# Patient Record
Sex: Male | Born: 1950 | Race: White | Hispanic: No | Marital: Married | Smoking: Former smoker
Health system: Southern US, Community
[De-identification: ages and names within clinical notes are randomized; demographics above are authoritative.]

## PROBLEM LIST (undated history)

## (undated) DIAGNOSIS — T7840XA Allergy, unspecified, initial encounter: Secondary | ICD-10-CM

## (undated) DIAGNOSIS — R972 Elevated prostate specific antigen [PSA]: Secondary | ICD-10-CM

## (undated) DIAGNOSIS — R251 Tremor, unspecified: Secondary | ICD-10-CM

## (undated) DIAGNOSIS — K219 Gastro-esophageal reflux disease without esophagitis: Secondary | ICD-10-CM

## (undated) DIAGNOSIS — M1990 Unspecified osteoarthritis, unspecified site: Secondary | ICD-10-CM

## (undated) DIAGNOSIS — H9319 Tinnitus, unspecified ear: Secondary | ICD-10-CM

## (undated) DIAGNOSIS — B1920 Unspecified viral hepatitis C without hepatic coma: Secondary | ICD-10-CM

## (undated) HISTORY — PX: JOINT REPLACEMENT: SHX530

## (undated) HISTORY — DX: Allergy, unspecified, initial encounter: T78.40XA

## (undated) HISTORY — PX: PROSTATE SURGERY: SHX751

## (undated) HISTORY — PX: REPLACEMENT TOTAL KNEE BILATERAL: SUR1225

## (undated) HISTORY — DX: Unspecified viral hepatitis C without hepatic coma: B19.20

## (undated) HISTORY — PX: HERNIA REPAIR: SHX51

---

## 2009-09-12 ENCOUNTER — Ambulatory Visit: Payer: Self-pay | Admitting: Family Medicine

## 2009-09-15 ENCOUNTER — Ambulatory Visit: Payer: Self-pay | Admitting: Internal Medicine

## 2009-09-29 ENCOUNTER — Encounter: Payer: Self-pay | Admitting: Orthopedic Surgery

## 2009-11-02 ENCOUNTER — Ambulatory Visit: Payer: Self-pay | Admitting: Urology

## 2010-03-22 ENCOUNTER — Ambulatory Visit: Payer: Self-pay | Admitting: Pain Medicine

## 2010-03-29 ENCOUNTER — Ambulatory Visit: Payer: Self-pay | Admitting: Pain Medicine

## 2010-04-05 ENCOUNTER — Ambulatory Visit: Payer: Self-pay | Admitting: Pain Medicine

## 2010-04-13 ENCOUNTER — Ambulatory Visit: Payer: Self-pay | Admitting: Pain Medicine

## 2010-07-19 ENCOUNTER — Ambulatory Visit: Payer: Self-pay | Admitting: Internal Medicine

## 2010-07-24 ENCOUNTER — Ambulatory Visit: Payer: Self-pay | Admitting: Orthopedic Surgery

## 2010-08-01 ENCOUNTER — Ambulatory Visit: Payer: Self-pay | Admitting: Orthopedic Surgery

## 2010-08-02 ENCOUNTER — Inpatient Hospital Stay: Payer: Self-pay | Admitting: Orthopedic Surgery

## 2010-08-24 ENCOUNTER — Encounter: Payer: Self-pay | Admitting: Orthopedic Surgery

## 2010-08-31 ENCOUNTER — Encounter: Payer: Self-pay | Admitting: Orthopedic Surgery

## 2011-07-12 ENCOUNTER — Other Ambulatory Visit: Payer: Self-pay | Admitting: Orthopedic Surgery

## 2011-07-12 LAB — BODY FLUID CELL COUNT WITH DIFFERENTIAL
Basophil: 0 %
Lymphocytes: 22 %
Neutrophils: 20 %
Other Cells BF: 0 %
Other Mononuclear Cells: 58 %

## 2011-07-12 LAB — SYNOVIAL FLUID, CRYSTAL: Crystals, Joint Fluid: NONE SEEN

## 2012-06-30 DIAGNOSIS — R972 Elevated prostate specific antigen [PSA]: Secondary | ICD-10-CM | POA: Insufficient documentation

## 2012-06-30 DIAGNOSIS — N138 Other obstructive and reflux uropathy: Secondary | ICD-10-CM | POA: Insufficient documentation

## 2012-06-30 DIAGNOSIS — R3911 Hesitancy of micturition: Secondary | ICD-10-CM | POA: Insufficient documentation

## 2012-08-06 ENCOUNTER — Ambulatory Visit: Payer: Self-pay | Admitting: Family Medicine

## 2013-06-15 ENCOUNTER — Emergency Department: Payer: Self-pay | Admitting: Emergency Medicine

## 2013-07-14 ENCOUNTER — Ambulatory Visit: Payer: Self-pay | Admitting: Orthopedic Surgery

## 2013-07-27 ENCOUNTER — Ambulatory Visit: Payer: Self-pay | Admitting: Pain Medicine

## 2013-07-28 ENCOUNTER — Ambulatory Visit: Payer: Self-pay | Admitting: Pain Medicine

## 2013-08-07 ENCOUNTER — Ambulatory Visit: Payer: Self-pay | Admitting: Internal Medicine

## 2013-08-10 ENCOUNTER — Ambulatory Visit: Payer: Self-pay | Admitting: Pain Medicine

## 2013-09-02 ENCOUNTER — Ambulatory Visit: Payer: Self-pay | Admitting: Gastroenterology

## 2014-02-09 ENCOUNTER — Ambulatory Visit: Payer: Self-pay | Admitting: Orthopedic Surgery

## 2014-02-09 LAB — URINALYSIS, COMPLETE
BACTERIA: NONE SEEN
BILIRUBIN, UR: NEGATIVE
Blood: NEGATIVE
Glucose,UR: NEGATIVE mg/dL (ref 0–75)
KETONE: NEGATIVE
LEUKOCYTE ESTERASE: NEGATIVE
Nitrite: NEGATIVE
PH: 6 (ref 4.5–8.0)
Protein: NEGATIVE
RBC,UR: <1 /HPF (ref 0–5)
Specific Gravity: 1.021 (ref 1.003–1.030)
WBC UR: 1 /HPF (ref 0–5)

## 2014-02-09 LAB — BASIC METABOLIC PANEL
Anion Gap: 8 (ref 7–16)
BUN: 13 mg/dL (ref 7–18)
CO2: 26 mmol/L (ref 21–32)
CREATININE: 0.86 mg/dL (ref 0.60–1.30)
Calcium, Total: 9 mg/dL (ref 8.5–10.1)
Chloride: 106 mmol/L (ref 98–107)
EGFR (African American): >60
EGFR (Non-African Amer.): >60
GLUCOSE: 95 mg/dL (ref 65–99)
OSMOLALITY: 279 (ref 275–301)
Potassium: 4 mmol/L (ref 3.5–5.1)
Sodium: 140 mmol/L (ref 136–145)

## 2014-02-09 LAB — CBC
HCT: 50.5 % (ref 40.0–52.0)
HGB: 16.7 g/dL (ref 13.0–18.0)
MCH: 32.1 pg (ref 26.0–34.0)
MCHC: 33 g/dL (ref 32.0–36.0)
MCV: 97 fL (ref 80–100)
PLATELETS: 173 10*3/uL (ref 150–440)
RBC: 5.2 10*6/uL (ref 4.40–5.90)
RDW: 12.8 % (ref 11.5–14.5)
WBC: 5.2 10*3/uL (ref 3.8–10.6)

## 2014-02-09 LAB — PROTIME-INR
INR: 0.9
Prothrombin Time: 12.4 secs (ref 11.5–14.7)

## 2014-02-09 LAB — MRSA PCR SCREENING

## 2014-02-09 LAB — APTT: Activated PTT: 27.1 secs (ref 23.6–35.9)

## 2014-02-25 ENCOUNTER — Inpatient Hospital Stay: Payer: Self-pay | Admitting: Orthopedic Surgery

## 2014-02-26 LAB — CBC WITH DIFFERENTIAL/PLATELET
BASOS ABS: 0 10*3/uL (ref 0.0–0.1)
Basophil %: 0.2 %
Eosinophil #: 0.1 10*3/uL (ref 0.0–0.7)
Eosinophil %: 1 %
HCT: 38.3 % — AB (ref 40.0–52.0)
HGB: 13.4 g/dL (ref 13.0–18.0)
LYMPHS PCT: 11 %
Lymphocyte #: 1 10*3/uL (ref 1.0–3.6)
MCH: 33.4 pg (ref 26.0–34.0)
MCHC: 34.9 g/dL (ref 32.0–36.0)
MCV: 96 fL (ref 80–100)
MONOS PCT: 12.5 %
Monocyte #: 1.1 x10 3/mm — ABNORMAL HIGH (ref 0.2–1.0)
NEUTROS PCT: 75.3 %
Neutrophil #: 6.6 10*3/uL — ABNORMAL HIGH (ref 1.4–6.5)
Platelet: 115 10*3/uL — ABNORMAL LOW (ref 150–440)
RBC: 4.01 10*6/uL — ABNORMAL LOW (ref 4.40–5.90)
RDW: 12.8 % (ref 11.5–14.5)
WBC: 8.7 10*3/uL (ref 3.8–10.6)

## 2014-02-26 LAB — BASIC METABOLIC PANEL
Anion Gap: 6 — ABNORMAL LOW (ref 7–16)
BUN: 17 mg/dL (ref 7–18)
CALCIUM: 7.9 mg/dL — AB (ref 8.5–10.1)
CHLORIDE: 102 mmol/L (ref 98–107)
CO2: 27 mmol/L (ref 21–32)
Creatinine: 1.24 mg/dL (ref 0.60–1.30)
EGFR (African American): >60
GLUCOSE: 112 mg/dL — AB (ref 65–99)
OSMOLALITY: 272 (ref 275–301)
POTASSIUM: 4.1 mmol/L (ref 3.5–5.1)
SODIUM: 135 mmol/L — AB (ref 136–145)

## 2014-02-27 LAB — HEMOGLOBIN: HGB: 12 g/dL — ABNORMAL LOW (ref 13.0–18.0)

## 2014-03-05 ENCOUNTER — Encounter: Payer: Self-pay | Admitting: Surgery

## 2014-03-22 ENCOUNTER — Encounter: Payer: Self-pay | Admitting: Orthopedic Surgery

## 2014-04-01 ENCOUNTER — Encounter: Payer: Self-pay | Admitting: Orthopedic Surgery

## 2014-04-07 ENCOUNTER — Ambulatory Visit: Payer: Self-pay | Admitting: Pain Medicine

## 2014-06-08 ENCOUNTER — Ambulatory Visit: Payer: Self-pay | Admitting: Internal Medicine

## 2014-09-13 DIAGNOSIS — B1920 Unspecified viral hepatitis C without hepatic coma: Secondary | ICD-10-CM | POA: Insufficient documentation

## 2014-10-23 NOTE — Discharge Summary (Signed)
PATIENT NAME:  Keith Fuller, Keith Fuller MR#:  [id number] DATE OF BIRTH:  October 20, 1950  DATE OF ADMISSION:  02/25/2014 DATE OF DISCHARGE:  02/28/2014  REASON FOR ADMISSION: Status post right total knee arthroplasty.    HOSPITAL COURSE: Mr. Genelle GatherShoffner is a 64 year old male with severe tricompartmental osteoarthritis. He underwent an uncomplicated right total knee arthroplasty on 02/25/2014. He is admitted to the orthopedic surgery service postoperatively. An Autovac drain was placed at the time of surgery in his right knee along with an indwelling Foley catheter. He was placed on 24 hours of postoperative antibiotics and had TED stockings and A-V I compression boots placed for DVT prophylaxis. Lovenox was started on postoperative day 1.  The patient had spinal anesthetic and was initially comfortable but on postoperative day number 1 was struggling with postoperative pain. He was changed from IV morphine to IV Dilaudid and his oxycodone dose was increased.  He was encouraged to continue using an incentive spirometer throughout his hospitalization. He began on continuous passive motion beginning on the night of surgery. On postoperative day number the patient's Autovac was removed.  On postoperative day number 2, his Foley catheter was removed. His dressings were changed. He had blisters both medial and lateral over the right leg below the incision. He was seen by my partner, Dr. Deeann SaintHoward Miller, on that day and the blisters were evacuated with a sterile needle and a dry sterile dressing was applied. His range of motion was from 0 to 60 degrees.  He remained neurovascularly intact and he had a stable hemoglobin. Physical and occupational therapy consults were ordered and this began on postoperative day number 1.  It continued throughout his hospitalization.  On postoperative day 3, the patient was afebrile.  His pain was better controlled.  He remained neurovascularly intact with dry dressings.  He was discharged on  Lovenox and will receive home physical therapy.  He was directed to follow up in clinic in 2 weeks for reevaluation and staple removal.  The patient will continue wearing his TED stockings at home as well. He will remain partial weight-bearing with the use of his walker.  He will continue to elevate his right lower extremity and use his Polar Care to help reduce swelling as well. He will contact our office with any questions or concerns.    ____________________________ Kathreen DevoidKevin Fuller. Allaya Abbasi, MD klk:nr D: 03/07/2014 13:44:35 ET T: 03/07/2014 14:14:25 ET JOB#: [id number]  cc: Kathreen DevoidKevin Fuller. Dalonte Hardage, MD, <Dictator>

## 2014-10-23 NOTE — Consult Note (Signed)
PATIENT NAME:  Keith Fuller, Garrell L MR#:  [id number] DATE OF BIRTH:  09/20/1950  PROGRESS NOTE  HISTORY OF PRESENT ILLNESS: Mr. Genelle GatherShoffner was seen at approximately 4:30 in the afternoon. He is POD #1 s/p right total knee arthroplaty.  He has been dealing with significant issues with postoperative pain. He is postoperative day number 1 from his right total knee. The patient's pain is now better controlled on IV dilaudid and an increased dose of oxycodone. He denies any chest pain, shortness of breath, abdominal pain, nausea or vomiting.   PHYSICAL EXAMINATION: The patient's dressing is clean, dry and intact. His leg compartments are soft and compressible. He had palpable pedal pulses, intact sensation to light touch throughout the right lower extremity. He had 5/5 strength of plantarflexion and dorsiflexion of the ankle, as well as inversion and eversion of the ankle, as well.   RADIOLOGY: The patient's postoperative films were reviewed. The components were in good position. There is no evidence of loosening, fracture, dislocation or other postoperative complication.   ASSESSMENT: The is patient stable postoperatively.   PLAN: Mr. Genelle GatherShoffner will continue to be monitored for his pain. He is on Lovenox for DVT prophylaxis. He is completing 24 hours of postoperative antibiotics. He also has TED stockings and AVI compression boots. He is encouraged to continue using his incentive spirometer. He will continue physical and occupational therapy.    ____________________________ Kathreen DevoidKevin L. Azalee Weimer, MD klk:JT D: 02/28/2014 10:39:16 ET T: 02/28/2014 11:50:08 ET JOB#: [id number]  cc: Kathreen DevoidKevin L. Hiedi Touchton, MD, <Dictator> Kathreen DevoidKEVIN L Keni Wafer MD ELECTRONICALLY SIGNED 03/01/2014 8:36

## 2014-10-23 NOTE — Discharge Summary (Signed)
PATIENT NAME:  Keith Fuller, [name] MR#:  [id number] DATE OF BIRTH:  1950-11-27  DATE OF ADMISSION:  02/25/2014 DATE OF DISCHARGE:  02/28/2014  REASON FOR ADMISSION: Status post right total knee arthroplasty.    HOSPITAL COURSE: Mr. Genelle GatherShoffner is a 64 year old male with severe tricompartmental osteoarthritis. He underwent an uncomplicated right total knee arthroplasty on 02/25/2014. He is admitted to the orthopedic surgery service postoperatively. An Autovac drain was placed at the time of surgery in his right knee along with an indwelling Foley catheter. He was placed on 24 hours of postoperative antibiotics and had TED stockings and A-V I compression boots placed for DVT prophylaxis. Lovenox was started on postoperative day 1.  The patient had spinal anesthetic and was initially comfortable but on postoperative day number 1 was struggling with postoperative pain. He was changed from IV morphine to IV Dilaudid and his oxycodone dose was increased.  He was encouraged to continue using an incentive spirometer throughout his hospitalization. He began on continuous passive motion beginning on the night of surgery. On postoperative day number the patient's Autovac was removed.  On postoperative day number 2, his Foley catheter was removed. His dressings were changed. He had blisters both medial and lateral over the right leg below the incision. He was seen by my partner, Dr. Deeann SaintHoward Miller, on that day and the blisters were evacuated with a sterile needle and a dry sterile dressing was applied. His range of motion was from 0 to 60 degrees.  He remained neurovascularly intact and he had a stable hemoglobin. Physical and occupational therapy consults were ordered and this began on postoperative day number 1.  It continued throughout his hospitalization.  On postoperative day 3, the patient was afebrile.  His pain was better controlled.  He remained neurovascularly intact with dry dressings.  He was discharged on  Lovenox and will receive home physical therapy.  He was directed to follow up in clinic in 2 weeks for reevaluation and staple removal.  The patient will continue wearing his TED stockings at home as well. He will remain partial weight-bearing with the use of his walker.  He will continue to elevate his right lower extremity and use his Polar Care to help reduce swelling as well. He will contact our office with any questions or concerns.  ____________________________ Kathreen DevoidKevin L. Britney Captain, MD klk:nr D: 03/07/2014 13:44:00 ET T: 03/07/2014 14:14:25 ET JOB#: [id number] Kathreen DevoidKEVIN L Mai Longnecker MD ELECTRONICALLY SIGNED 03/24/2014 14:44

## 2014-10-23 NOTE — Op Note (Signed)
PATIENT NAME:  Keith Fuller, Keith Fuller MR#:  [id number] DATE OF BIRTH:  Jan 17, 1951  DATE OF PROCEDURE:  02/25/2014  PREOPERATIVE DIAGNOSIS:  Right tricompartmental knee osteoarthritis.  POSTOPERATIVE DIAGNOSIS: Right tricompartmental knee osteoarthritis.   PROCEDURE: Right total knee arthroplasty.   SURGEON: Thornton Park, MD    ASSISTANT:  Earnestine Leys, MD.   ESTIMATED BLOOD LOSS: 10 mL   FOLEY OUTPUT: 100 mL  FLUIDS:  1500 mL.   TOURNIQUET TIME: 122 minutes.   IMPLANTS:  Include Dupuy Sigma femoral posterior stabilized cemented right size 4 femoral component, a Dupuy rotating platform, MBT keel tibial tray size 4 cemented, a tibial inset 12.5 mm rotating platform, stabilized, and oval dome patella 3-pegged, 41 mm diameter.   INDICATION FOR THE PROCEDURE:  [name] is a 64 year old male who has had years of right knee osteoarthritis and pain.  His symptoms become progressively worse and are affecting his daily life and his ability to ambulate any significant distance.  His x-ray films had demonstrated advanced tricompartmental osteoarthritis changes, including bone on bone contact in the medial compartment with marginal osteophyte formation, subchondral sclerosis and cyst formation.  The patient has failed all nonoperative management including corticosteroid injections and physical therapy.  He wished to proceed with a right total knee arthroplasty.   PROCEDURE NOTE: The patient was met in the preoperative area.  I had marked the right knee within the operative field with my initials and the word "yes" according to the hospital's right site protocol.  The patient had already signed consent form in my office prior to the date of surgery. He was well aware of the risks of surgery, having been through a left total knee arthroplasty.  He understands the risks include infection requiring the removal of the prosthesis, bleeding requiring blood transfusion, nerve or blood vessel injury,  persistent pain or instability, knee stiffness or arthrofibrosis, hardware failure, loosening of the components, fracture, dislocation and the need for further surgery.  Medical risks include, but are not limited to, DVT and pulmonary embolism, myocardial infarction, stroke, pneumonia, respiratory failure and death.  The patient understood these risks and wished to proceed.   The patient was brought to the Operating Room. He underwent a spinal anesthetic by the anesthesia service.  He was then placed supine on the operative table.  The patient had a tourniquet applied to the right upper thigh and a bump was placed under his right hip. He was prepped and draped in a sterile fashion after all bony prominences were adequately padded.  A timeout was performed to verify the patient's name, date of birth, medical record number, correct site of surgery and correct procedure to be performed.  It was also used to verify the patient had received antibiotics and that all appropriate instruments, implants, and radiographic studies were available in the room. Once all in attendance were in agreement, the case began.   A midline incision was made with a #10 blade. The full thickness skin flaps were then developed.  A medial arthrotomy was then created with a deep #10 blade. The patella was everted and the knee was flexed to approximately 90 degrees.  A medial soft tissue sleeve was then developed.  The cruciate ligaments and medial and lateral meniscus were then debrided along with Hoffa's fat pad.  A drill was then inserted into the distal femur and into the femoral canal.  This allowed for placement of the intramedullary distal femoral cutting guide.  This was pinned in position, allowing for a  10 mm distal femoral resection.  This cut was made with an oscillating saw.  The attention was then turned to the tibia.   An external tibial alignment guide was positioned over the anterior tibia.  A slotted stylus was used to  ensure that 2 mm off the low side and 8 mm off the high side were being taken.  The tibial cutting block was then pinned into position.  A drop down guide was then applied to the tibial block to ensure that the cut would be perpendicular to the mechanical axis of the tibia.  A cross pin was then applied.  The slope of the anterior tibia was mirrored during placement of the tibial cutting block with the external tibial alignment guide.  An oscillating saw was then used to make the proximal tibial cut.  The tibial cutting block was then removed.  The knee was placed in extension and the extension block was placed into the knee joint.  The patient was found to have excellent knee alignment. The ligaments were slightly tight medially and the soft tissue release was made medially.  Once the release was made, the 12.5 mm extension spacer block had the best fit while maintaining excellent alignment.   The attention was then turned back to the femoral side.  Posterior referencing femoral sizing guide was then applied to the distal femur. The femur was measured to be a size 4. Through the femoral sizing guide, 2 pins were drilled.  The sizing guide was then removed and a size 4 cutting block was then applied to the distal femur. A stylus was used to ensure that no anterior notching would occur during the anterior cut. The oscillating saw was then used to make the anterior and posterior cuts, as well as the chamfer cuts.  The cutting block was then removed. The box cutting guide was then applied to the distal femur and pinned into position.  Again, an oscillating saw was used to create the box for the posterior stabilized component.  This cutting guide was then removed.  The attention was then turned back to the tibia.  A size four tibial trial was then applied on the surface of the tibia.  This had excellent coverage. It was pinned into position. The drill tower was then gently malleted into position. The keel was reamed  through this tower.  The winged punch was then gently malleted in position.  The drill tower was removed along with the pins.  This allowed for placement of a trial tibial aligner.  The trial femoral component was also positioned and a 12.5 mm liner was placed.  The knee was brought into full extension and had excellent ligamentous stability to varus or valgus stress testing in full extension in the midrange of flexion.  Finally, the attention was turned to the patella.   The thickness of the patella was measured with a caliper. The patella was sized to be approximately and 41 mm in diameter.  The patella cutting guide was then applied to the patella and an oscillating saw was used to make the appropriate cuts.  Three pegs were then drilled into the patella using the 41 mm patella guide.  The patella trial was then applied. The patella was then taken through a full range of motion within the femoral component and found to be stable. All trial components were then removed.  The cut surfaces of all 3 compartments of the knee were then pulse irrigated.  Exparel was injected into  the capsule along with a mix of Marcaine, Toradol, and morphine.  These injections were given for postoperative pain control.  All bony surfaces were then dried. Methylmethacrylate was applied to all bone surfaces. The tibial component was placed first, then the femoral component and finally, the patellar component.  All excess methylmethacrylate was removed before it was allowed to harden. A 12.5 mm trial was placed while the methylmethacrylate was curing.  This trial liner was then removed. The joint again was copiously irrigated with pulse lavage.  The actual posterior stabilized, rotating platform tibial insert was then placed.  The knee was then brought into full extension.  An Autovac drain was placed exiting the superolateral knee.  The medial arthrotomy was closed with a #1 Ethibond, the wound was then copiously irrigated again.  The  subcutaneous tissues were closed in 2 layers with 0 Vicryl and a 2-0 Vicryl.  The skin was approximated with staples.  A dry sterile and compressive dressing was applied to the right knee along with a knee immobilizer and a Polar Care sleeve and TENS unit leads. The patient was then transferred to a hospital bed and brought to the PACU in stable condition.  I was scrubbed and present for the entire case, and all sharp and instrument counts were correct at the conclusion of the case.  I spoke with the patient's wife in the postoperative consultation room to let her know the case had gone without complication. The patient was stable in the recovery room.      ____________________________ Timoteo Gaul, MD klk:DT D: 03/01/2014 19:17:50 ET T: 03/01/2014 19:43:24 ET JOB#: [id number]  cc: Timoteo Gaul, MD, <Dictator> Timoteo Gaul MD ELECTRONICALLY SIGNED 03/07/2014 13:49

## 2014-10-23 NOTE — Consult Note (Signed)
PATIENT NAME:  Keith Fuller, Keith Fuller MR#:  [id number] DATE OF BIRTH:  [birth year]  DATE OF PROGRESS NOTE:  02/26/2014  HISTORY OF PRESENT ILLNESS: Keith Fuller was seen at approximately 4:30 in the afternoon.  Keith Fuller is POD #1 s/p right total knee arthroplasty.   He has been dealing with significant issues with postoperative pain. He is postoperative day number 1 from his right total knee. The patient's pain is now better controlled on IV dilaudid and an increased dose of oxycodone. He denies any chest pain, shortness of breath, abdominal pain, nausea or vomiting.   PHYSICAL EXAMINATION: The patient's dressing is clean, dry and intact. His leg compartments are soft and compressible. He had palpable pedal pulses, intact sensation to light touch throughout the right lower extremity. He had 5/5 strength of plantarflexion and dorsiflexion of the ankle, as well as inversion and eversion of the ankle, as well.   RADIOLOGY: The patient's postoperative films were reviewed. The components were in good position. There is no evidence of loosening, fracture, dislocation or other postoperative complication.   ASSESSMENT: The is patient stable postoperatively.   PLAN: Keith Fuller will continue to be monitored for his pain. He is on Lovenox for DVT prophylaxis. He is completing 24 hours of postoperative antibiotics. He also has TED stockings and AVI compression boots. He is encouraged to continue using his incentive spirometer. He will continue physical and occupational therapy.    ____________________________ Kathreen DevoidKevin Fuller. Ranny Wiebelhaus, MD klk:JT D: 02/28/2014 10:39:16 ET T: 02/28/2014 11:52:32 ET JOB#: [id number]  cc: Kathreen DevoidKevin Fuller. Verley Pariseau, MD, <Dictator> Kathreen DevoidKEVIN Fuller Naoki Migliaccio MD ELECTRONICALLY SIGNED 03/01/2014 8:37

## 2015-04-19 ENCOUNTER — Ambulatory Visit: Payer: BLUE CROSS/BLUE SHIELD | Attending: Anesthesiology | Admitting: Anesthesiology

## 2015-04-19 ENCOUNTER — Encounter: Payer: Self-pay | Admitting: Anesthesiology

## 2015-04-19 VITALS — BP 138/81 | HR 56 | Temp 97.5°F | Resp 14 | Ht 69.0 in | Wt 200.0 lb

## 2015-04-19 DIAGNOSIS — M5416 Radiculopathy, lumbar region: Secondary | ICD-10-CM | POA: Diagnosis not present

## 2015-04-19 DIAGNOSIS — M545 Low back pain: Secondary | ICD-10-CM | POA: Diagnosis not present

## 2015-04-19 DIAGNOSIS — B1920 Unspecified viral hepatitis C without hepatic coma: Secondary | ICD-10-CM | POA: Insufficient documentation

## 2015-04-19 DIAGNOSIS — Z87891 Personal history of nicotine dependence: Secondary | ICD-10-CM | POA: Diagnosis not present

## 2015-04-19 DIAGNOSIS — M549 Dorsalgia, unspecified: Secondary | ICD-10-CM | POA: Diagnosis present

## 2015-04-19 DIAGNOSIS — M5432 Sciatica, left side: Secondary | ICD-10-CM | POA: Insufficient documentation

## 2015-04-19 DIAGNOSIS — M5136 Other intervertebral disc degeneration, lumbar region: Secondary | ICD-10-CM | POA: Diagnosis not present

## 2015-04-19 NOTE — Patient Instructions (Addendum)
Epidural Steroid Injection Patient Information  Description: The epidural space surrounds the nerves as they exit the spinal cord.  In some patients, the nerves can be compressed and inflamed by a bulging disc or a tight spinal canal (spinal stenosis).  By injecting steroids into the epidural space, we can bring irritated nerves into direct contact with a potentially helpful medication.  These steroids act directly on the irritated nerves and can reduce swelling and inflammation which often leads to decreased pain.  Epidural steroids may be injected anywhere along the spine and from the neck to the low back depending upon the location of your pain.   After numbing the skin with local anesthetic (like Novocaine), a small needle is passed into the epidural space slowly.  You may experience a sensation of pressure while this is being done.  The entire block usually last less than 10 minutes.  Conditions which may be treated by epidural steroids:   Low back and leg pain  Neck and arm pain  Spinal stenosis  Post-laminectomy syndrome  Herpes zoster (shingles) pain  Pain from compression fractures  Preparation for the injection:  1. Do not eat any solid food or dairy products within 6 hours of your appointment.  2. You may drink clear liquids up to 2 hours before appointment.  Clear liquids include water, black coffee, juice or soda.  No milk or cream please. 3. You may take your regular medication, including pain medications, with a sip of water before your appointment  Diabetics should hold regular insulin (if taken separately) and take 1/2 normal NPH dos the morning of the procedure.  Carry some sugar containing items with you to your appointment. 4. A driver must accompany you and be prepared to drive you home after your procedure.  5. Bring all your current medications with your. 6. An IV may be inserted and sedation may be given at the discretion of the physician.   7. A blood pressure  cuff, EKG and other monitors will often be applied during the procedure.  Some patients may need to have extra oxygen administered for a short period. 8. You will be asked to provide medical information, including your allergies, prior to the procedure.  We must know immediately if you are taking blood thinners (like Coumadin/Warfarin)  Or if you are allergic to IV iodine contrast (dye). We must know if you could possible be pregnant.  Possible side-effects:  Bleeding from needle site  Infection (rare, may require surgery)  Nerve injury (rare)  Numbness & tingling (temporary)  Difficulty urinating (rare, temporary)  Spinal headache ( a headache worse with upright posture)  Light -headedness (temporary)  Pain at injection site (several days)  Decreased blood pressure (temporary)  Weakness in arm/leg (temporary)  Pressure sensation in back/neck (temporary)  Call if you experience:  Fever/chills associated with headache or increased back/neck pain.  Headache worsened by an upright position.  New onset weakness or numbness of an extremity below the injection site  Hives or difficulty breathing (go to the emergency room)  Inflammation or drainage at the infection site  Severe back/neck pain  Any new symptoms which are concerning to you  Please note:  Although the local anesthetic injected can often make your back or neck feel good for several hours after the injection, the pain will likely return.  It takes 3-7 days for steroids to work in the epidural space.  You may not notice any pain relief for at least that one week.    If effective, we will often do a series of three injections spaced 3-6 weeks apart to maximally decrease your pain.  After the initial series, we generally will wait several months before considering a repeat injection of the same type.  If you have any questions, please call [phone] [facility] Clinic[facility] R[facility]KS AND  COMPLICATIONS  What are the risk, side effects and possible complications? Generally speaking, most procedures are safe.  However, with any procedure there are risks, side effects, and the possibility of complications.  The risks and complications are dependent upon the sites that are lesioned, or the type of nerve block to be performed.  The closer the procedure is to the spine, the more serious the risks are.  Great care is taken when placing the radio frequency needles, block needles or lesioning probes, but sometimes complications can occur. 1. Infection: Any time there is an injection through the skin, there is a risk of infection.  This is why sterile conditions are used for these blocks.  There are four possible types of infection. 1. Localized skin infection. 2. Central Nervous System Infection-This can be in the form of Meningitis, which can be deadly. 3. Epidural Infections-This can be in the form of an epidural abscess, which can cause pressure inside of the spine, causing compression of the spinal cord with subsequent paralysis. This would require an emergency surgery to decompress, and there are no guarantees that the patient would recover from the paralysis. 4. Discitis-This is an infection of the intervertebral discs.  It occurs in about 1% of discography procedures.  It is difficult to treat and it may lead to surgery.        2. Pain: the needles have to go through skin and soft tissues, will cause soreness.       3. Damage to internal structures:  The nerves to be lesioned may be near blood vessels or    other nerves which can be potentially damaged.       4. Bleeding: Bleeding is more common if the patient is taking blood thinners such as  aspirin, Coumadin, Ticiid, Plavix, etc., or if he/she have some genetic predisposition  such as hemophilia. Bleeding into the spinal canal can cause compression of the spinal  cord with subsequent paralysis.  This would require an emergency surgery  to  decompress and there are no guarantees that the patient would recover from the  paralysis.       5. Pneumothorax:  Puncturing of a lung is a possibility, every time a needle is introduced in  the area of the chest or upper back.  Pneumothorax refers to free air around the  collapsed lung(s), inside of the thoracic cavity (chest cavity).  Another two possible  complications related to a similar event would include: Hemothorax and Chylothorax.   These are variations of the Pneumothorax, where instead of air around the collapsed  lung(s), you may have blood or chyle, respectively.       6. Spinal headaches: They may occur with any procedures in the area of the spine.       7. Persistent CSF (Cerebro-Spinal Fluid) leakage: This is a rare problem, but may occur  with prolonged intrathecal or epidural catheters either due to the formation of a fistulous  track or a dural tear.       8. Nerve damage: By working so close to the spinal cord, there is always a possibility of  nerve damage, which could be   as serious as a permanent spinal cord injury with  paralysis.       9. Death:  Although rare, severe deadly allergic reactions known as "Anaphylactic  reaction" can occur to any of the medications used.      10. Worsening of the symptoms:  We can always make thing worse.  What are the chances of something like this happening? Chances of any of this occuring are extremely low.  By statistics, you have more of a chance of getting killed in a motor vehicle accident: while driving to the hospital than any of the above occurring .  Nevertheless, you should be aware that they are possibilities.  In general, it is similar to taking a shower.  Everybody knows that you can slip, hit your head and get killed.  Does that mean that you should not shower again?  Nevertheless always keep in mind that statistics do not mean anything if you happen to be on the wrong side of them.  Even if a procedure has a 1 (one) in a 1,000,000  (million) chance of going wrong, it you happen to be that one..Also, keep in mind that by statistics, you have more of a chance of having something go wrong when taking medications.  Who should not have this procedure? If you are on a blood thinning medication (e.g. Coumadin, Plavix, see list of "Blood Thinners"), or if you have an active infection going on, you should not have the procedure.  If you are taking any blood thinners, please inform your physician.  How should I prepare for this procedure?  Do not eat or drink anything at least six hours prior to the procedure.  Bring a driver with you .  It cannot be a taxi.  Come accompanied by an adult that can drive you back, and that is strong enough to help you if your legs get weak or numb from the local anesthetic.  Take all of your medicines the morning of the procedure with just enough water to swallow them.  If you have diabetes, make sure that you are scheduled to have your procedure done first thing in the morning, whenever possible.  If you have diabetes, take only half of your insulin dose and notify our nurse that you have done so as soon as you arrive at the clinic.  If you are diabetic, but only take blood sugar pills (oral hypoglycemic), then do not take them on the morning of your procedure.  You may take them after you have had the procedure.  Do not take aspirin or any aspirin-containing medications, at least eleven (11) days prior to the procedure.  They may prolong bleeding.  Wear loose fitting clothing that may be easy to take off and that you would not mind if it got stained with Betadine or blood.  Do not wear any jewelry or perfume  Remove any nail coloring.  It will interfere with some of our monitoring equipment.  NOTE: Remember that this is not meant to be interpreted as a complete list of all possible complications.  Unforeseen problems may occur.  BLOOD THINNERS The following drugs contain aspirin or other  products, which can cause increased bleeding during surgery and should not be taken for 2 weeks prior to and 1 week after surgery.  If you should need take something for relief of minor pain, you may take acetaminophen which is found in Tylenol,m Datril, Anacin-3 and Panadol. It is not blood thinner. The products listed below   are.  Do not take any of the products listed below in addition to any listed on your instruction sheet.  A.P.C or A.P.C with Codeine Codeine Phosphate Capsules #3 Ibuprofen Ridaura  ABC compound Congesprin Imuran rimadil  Advil Cope Indocin Robaxisal  Alka-Seltzer Effervescent Pain Reliever and Antacid Coricidin or Coricidin-D  Indomethacin Rufen  Alka-Seltzer plus Cold Medicine Cosprin Ketoprofen S-A-C Tablets  Anacin Analgesic Tablets or Capsules Coumadin Korlgesic Salflex  Anacin Extra Strength Analgesic tablets or capsules CP-2 Tablets Lanoril Salicylate  Anaprox Cuprimine Capsules Levenox Salocol  Anexsia-D Dalteparin Magan Salsalate  Anodynos Darvon compound Magnesium Salicylate Sine-off  Ansaid Dasin Capsules Magsal Sodium Salicylate  Anturane Depen Capsules Marnal Soma  APF Arthritis pain formula Dewitt's Pills Measurin Stanback  Argesic Dia-Gesic Meclofenamic Sulfinpyrazone  Arthritis Bayer Timed Release Aspirin Diclofenac Meclomen Sulindac  Arthritis pain formula Anacin Dicumarol Medipren Supac  Analgesic (Safety coated) Arthralgen Diffunasal Mefanamic Suprofen  Arthritis Strength Bufferin Dihydrocodeine Mepro Compound Suprol  Arthropan liquid Dopirydamole Methcarbomol with Aspirin Synalgos  ASA tablets/Enseals Disalcid Micrainin Tagament  Ascriptin Doan's Midol Talwin  Ascriptin A/D Dolene Mobidin Tanderil  Ascriptin Extra Strength Dolobid Moblgesic Ticlid  Ascriptin with Codeine Doloprin or Doloprin with Codeine Momentum Tolectin  Asperbuf Duoprin Mono-gesic Trendar  Aspergum Duradyne Motrin or Motrin IB Triminicin  Aspirin plain, buffered or enteric  coated Durasal Myochrisine Trigesic  Aspirin Suppositories Easprin Nalfon Trillsate  Aspirin with Codeine Ecotrin Regular or Extra Strength Naprosyn Uracel  Atromid-S Efficin Naproxen Ursinus  Auranofin Capsules Elmiron Neocylate Vanquish  Axotal Emagrin Norgesic Verin  Azathioprine Empirin or Empirin with Codeine Normiflo Vitamin E  Azolid Emprazil Nuprin Voltaren  Bayer Aspirin plain, buffered or children's or timed BC Tablets or powders Encaprin Orgaran Warfarin Sodium  Buff-a-Comp Enoxaparin Orudis Zorpin  Buff-a-Comp with Codeine Equegesic Os-Cal-Gesic   Buffaprin Excedrin plain, buffered or Extra Strength Oxalid   Bufferin Arthritis Strength Feldene Oxphenbutazone   Bufferin plain or Extra Strength Feldene Capsules Oxycodone with Aspirin   Bufferin with Codeine Fenoprofen Fenoprofen Pabalate or Pabalate-SF   Buffets II Flogesic Panagesic   Buffinol plain or Extra Strength Florinal or Florinal with Codeine Panwarfarin   Buf-Tabs Flurbiprofen Penicillamine   Butalbital Compound Four-way cold tablets Penicillin   Butazolidin Fragmin Pepto-Bismol   Carbenicillin Geminisyn Percodan   Carna Arthritis Reliever Geopen Persantine   Carprofen Gold's salt Persistin   Chloramphenicol Goody's Phenylbutazone   Chloromycetin Haltrain Piroxlcam   Clmetidine heparin Plaquenil   Cllnoril Hyco-pap Ponstel   Clofibrate Hydroxy chloroquine Propoxyphen         Before stopping any of these medications, be sure to consult the physician who ordered them.  Some, such as Coumadin (Warfarin) are ordered to prevent or treat serious conditions such as "deep thrombosis", "pumonary embolisms", and other heart problems.  The amount of time that you may need off of the medication may also vary with the medication and the reason for which you were taking it.  If you are taking any of these medications, please make sure you notify your pain physician before you undergo any procedures.          

## 2015-04-19 NOTE — Progress Notes (Signed)
Safety precautions to be maintained throughout the outpatient stay will include: orient to surroundings, keep bed in low position, maintain call bell within reach at all times, provide assistance with transfer out of bed and ambulation.  Pt unable to get urine do to prostate problem no meds today

## 2015-04-21 ENCOUNTER — Ambulatory Visit: Payer: BLUE CROSS/BLUE SHIELD | Attending: Anesthesiology | Admitting: Anesthesiology

## 2015-04-21 ENCOUNTER — Encounter: Payer: Self-pay | Admitting: Anesthesiology

## 2015-04-21 VITALS — BP 143/65 | HR 44 | Temp 97.9°F | Resp 17 | Ht 69.0 in | Wt 195.0 lb

## 2015-04-21 DIAGNOSIS — M5431 Sciatica, right side: Secondary | ICD-10-CM | POA: Insufficient documentation

## 2015-04-21 DIAGNOSIS — M5432 Sciatica, left side: Secondary | ICD-10-CM

## 2015-04-21 DIAGNOSIS — M545 Low back pain: Secondary | ICD-10-CM | POA: Insufficient documentation

## 2015-04-21 DIAGNOSIS — M47816 Spondylosis without myelopathy or radiculopathy, lumbar region: Secondary | ICD-10-CM

## 2015-04-21 DIAGNOSIS — M1288 Other specific arthropathies, not elsewhere classified, other specified site: Secondary | ICD-10-CM | POA: Diagnosis not present

## 2015-04-21 DIAGNOSIS — M5136 Other intervertebral disc degeneration, lumbar region: Secondary | ICD-10-CM | POA: Diagnosis present

## 2015-04-21 MED ORDER — TRIAMCINOLONE ACETONIDE 40 MG/ML IJ SUSP
INTRAMUSCULAR | Status: AC
  Administered 2015-04-21: 15:00:00
  Filled 2015-04-21: qty 1

## 2015-04-21 MED ORDER — LIDOCAINE HCL (PF) 1 % IJ SOLN
INTRAMUSCULAR | Status: AC
  Administered 2015-04-21: 15:00:00
  Filled 2015-04-21: qty 5

## 2015-04-21 MED ORDER — IOHEXOL 180 MG/ML  SOLN
INTRAMUSCULAR | Status: AC
  Administered 2015-04-21: 15:00:00
  Filled 2015-04-21: qty 20

## 2015-04-21 MED ORDER — TRIAMCINOLONE ACETONIDE 40 MG/ML IJ SUSP
INTRAMUSCULAR | Status: AC
  Filled 2015-04-21: qty 1

## 2015-04-21 MED ORDER — CYCLOBENZAPRINE HCL 10 MG PO TABS: 10.0000 mg | ORAL_TABLET | Freq: Three times a day (TID) | ORAL | Status: DC | PRN

## 2015-04-21 MED ORDER — SODIUM CHLORIDE 0.9 % IJ SOLN
INTRAMUSCULAR | Status: AC
  Administered 2015-04-21: 15:00:00
  Filled 2015-04-21: qty 10

## 2015-04-21 MED ORDER — ROPIVACAINE HCL 2 MG/ML IJ SOLN
INTRAMUSCULAR | Status: AC
  Administered 2015-04-21: 15:00:00
  Filled 2015-04-21: qty 10

## 2015-04-21 NOTE — Progress Notes (Signed)
Safety precautions to be maintained throughout the outpatient stay will include: orient to surroundings, keep bed in low position, maintain call bell within reach at all times, provide assistance with transfer out of bed and ambulation.  

## 2015-04-21 NOTE — Progress Notes (Signed)
Called in rx to [facility],[address], [place], [phone]) of Flexeril 10 mg  With one refill

## 2015-04-21 NOTE — Progress Notes (Signed)
PROCEDURE PERFORMED: Lumbar epidural steroid injection under fluroscopic guidance with moderate sedation.  CC:  Left lower back and hip pain with radiation into the buttocks and left calf   HPI:  L5S1 interlaminar LESI with sedation under flouro  Physical Exam:   No change with persistent pain with standing extension at the low back and left lateral rotation.  Pos slr on the left PERRL, EOMI  Heart RRR   LCTA  Musculoskeletal: As above    Assessment:  1. 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  Facet arthopathy  3. Myofacial LBP  PLAN:   1. LESI today with R/B fully reviewed and questions answered 2.Medications: We will continue presently prescribed medications. 3, RTC in 3 weeks for repeat LESI vs. Possible facet block     Procedure:  LESI:  NOTE: The risks, benefits, and expectations of the procedure have been discussed and explained to the patient who was understanding and in agreement with suggested treatment plan. No guarantees were made.  DESCRIPTION OF PROCEDURE: Lumbar epidural steroid injection with IV Versed, EKG, blood pressure, pulse, and pulse oximetry monitoring. The procedure was performed with the patient in the prone position under fluoroscopic guidance. A local anesthetic skin wheal of 1.5% plain lidocaine was performed at the appropriate site L5S1 after fluoroscopic identifictation  Using strict aseptic technique, I then advanced an 18-gauge Tuohy epidural needle in the midline via loss-of-resistance  Technique. There was negative aspiration for negative aspiration for heme or  CSF.  I then confirmed position with both AP and Lateral fluoroscan.  A total of 5 mL of Preservative-Free normal saline with 40 mg of Kenalog and 1cc Ropicaine 0.2 percent was injected incrementally via the  epidurally placed needle. Needle removed. The patient tolerated the injection well. He was convalesced and discharged to home in stable condition without  sequalae.   @James  Pernell DupreAdams, MD@

## 2015-04-21 NOTE — Patient Instructions (Signed)
Epidural Steroid Injection Patient Information  Description: The epidural space surrounds the nerves as they exit the spinal cord.  In some patients, the nerves can be compressed and inflamed by a bulging disc or a tight spinal canal (spinal stenosis).  By injecting steroids into the epidural space, we can bring irritated nerves into direct contact with a potentially helpful medication.  These steroids act directly on the irritated nerves and can reduce swelling and inflammation which often leads to decreased pain.  Epidural steroids may be injected anywhere along the spine and from the neck to the low back depending upon the location of your pain.   After numbing the skin with local anesthetic (like Novocaine), a small needle is passed into the epidural space slowly.  You may experience a sensation of pressure while this is being done.  The entire block usually last less than 10 minutes.  Conditions which may be treated by epidural steroids:   Low back and leg pain  Neck and arm pain  Spinal stenosis  Post-laminectomy syndrome  Herpes zoster (shingles) pain  Pain from compression fractures  Preparation for the injection:  1. Do not eat any solid food or dairy products within 6 hours of your appointment.  2. You may drink clear liquids up to 2 hours before appointment.  Clear liquids include water, black coffee, juice or soda.  No milk or cream please. 3. You may take your regular medication, including pain medications, with a sip of water before your appointment  Diabetics should hold regular insulin (if taken separately) and take 1/2 normal NPH dos the morning of the procedure.  Carry some sugar containing items with you to your appointment. 4. A driver must accompany you and be prepared to drive you home after your procedure.  5. Bring all your current medications with your. 6. An IV may be inserted and sedation may be given at the discretion of the physician.   7. A blood pressure  cuff, EKG and other monitors will often be applied during the procedure.  Some patients may need to have extra oxygen administered for a short period. 8. You will be asked to provide medical information, including your allergies, prior to the procedure.  We must know immediately if you are taking blood thinners (like Coumadin/Warfarin)  Or if you are allergic to IV iodine contrast (dye). We must know if you could possible be pregnant.  Possible side-effects:  Bleeding from needle site  Infection (rare, may require surgery)  Nerve injury (rare)  Numbness & tingling (temporary)  Difficulty urinating (rare, temporary)  Spinal headache ( a headache worse with upright posture)  Light -headedness (temporary)  Pain at injection site (several days)  Decreased blood pressure (temporary)  Weakness in arm/leg (temporary)  Pressure sensation in back/neck (temporary)  Call if you experience:  Fever/chills associated with headache or increased back/neck pain.  Headache worsened by an upright position.  New onset weakness or numbness of an extremity below the injection site  Hives or difficulty breathing (go to the emergency room)  Inflammation or drainage at the infection site  Severe back/neck pain  Any new symptoms which are concerning to you  Please note:  Although the local anesthetic injected can often make your back or neck feel good for several hours after the injection, the pain will likely return.  It takes 3-7 days for steroids to work in the epidural space.  You may not notice any pain relief for at least that one week.    If effective, we will often do a series of three injections spaced 3-6 weeks apart to maximally decrease your pain.  After the initial series, we generally will wait several months before considering a repeat injection of the same type.  If you have any questions, please call [phone]  [facility]  Discharge Instructions  General Discharge Instructions :  If you need to reach your doctor call: Monday-Friday 8:00 am - 4:00 pm at [phone] or toll free [phone].  After clinic hours [phone] to have operator reach doctor.  Bring all of your medication bottles to all your appointments in the pain clinic.  To cancel or reschedule your appointment with Pain Management please remember to call 24 hours in advance to avoid a fee.  Refer to the educational materials which you have been given on: General Risks, I had my Procedure. Discharge Instructions, Post Sedation.  Post Procedure Instructions:  The drugs you were given will stay in your system until tomorrow, so for the next 24 hours you should not drive, make any legal decisions or drink any alcoholic beverages.  You may eat anything you prefer, but it is better to start with liquids then soups and crackers, and gradually work up to solid foods.  Please notify your doctor immediately if you have any unusual bleeding, trouble breathing or pain that is not related to your normal pain.  Depending on the type of procedure that was done, some parts of your body may feel week and/or numb.  This usually clears up by tonight or the next day.  Walk with the use of an assistive device or accompanied by an adult for the 24 hours.  You may use ice on the affected area for the first 24 hours.  Put ice in a Ziploc bag and cover with a towel and place against area 15 minutes on 15 minutes off.  You may switch to heat after 24 hours. 

## 2015-04-21 NOTE — Progress Notes (Signed)
LOGO@  Subjective:  Patient ID: Keith Fuller, male    DOB: [birth year]  Age: 64 y.o. MRN: [id number]  CC: Back Pain  primarily left side greater than right with radiation in the bilateral buttocks and hips. This is also associated left calf cramping  HPI Keith Fuller presents for new patient evaluation. He's had a long-standing history of low back pain for approximately 3 years. Gradually intensified over the past few months. In the past she's been seen by Dr. Leticia Clas and had an epidural steroid again nearly complete relief of his low back pain. At this point is describing a maximum VAS score of 7 and a minimum of 3 which is exacerbated in the afternoon and evening with a sizzling type low back pain also worse after exercise and with weightbearing. Motion sitting and walking working seemed to worsen his pain and it is alleviated by sleeping using a back brace and some stretching and lying down and medication management. He has associated constipation tingling of the feet weakness when prolonged weightbearing and the pain is described as burning constant hot disabling and distressing. A previous CT of his back was performed showing evidence of degenerative disc disease and is also had previous nerve conduction studies and he is status post a neurologic evaluation previous orthopedic evaluation by Dr. Kennith Center  Past Medical History  Diagnosis Date  . Allergy   . Hepatitis C     denies any virus in system now    Past Surgical History  Procedure Laterality Date  . Joint replacement    . Hernia repair    . Prostate surgery      biopsy    Family History  Problem Relation Age of Onset  . Arthritis Mother   . Cancer Father   . Stroke Father     Social History  Substance Use Topics  . Smoking status: Former Games developer  . Smokeless tobacco: Not on file  . Alcohol Use: Not on file    ROS Review of Systems  Objective:   Today's Vitals: BP 138/81 mmHg  Pulse 56  Temp(Src) 97.5  F (36.4 C) (Oral)  Resp 14  Ht  (1.753 m)  Wt 200 lb (90.719 kg)  BMI 29.52 kg/m2  SpO2 96%  Physical Exam patient is alert oriented 3 for compliant he is appropriate during the examination and a good historian. Heart is regular rate and rhythm lungs are clear to auscultation section low back reveals some paraspinous muscle tenderness. He has no overt trigger points. With the patient in the supine position he has a positive straight leg raise on the left side and negative on the right but muscle tone and bulk is good sensation appears to be grossly intact with patient standing and extension of the low back he has pain with lateral rotation and loading of the left side greater than the same maneuver on the right side. This does reproduce pain that radiates in the left hip and buttocks and posterior leg. Muscle tone in the low back is good with tenderness throughout.  Assessment & Plan:   Problem List Items Addressed This Visit    None    Visit Diagnoses    DDD (degenerative disc disease), lumbar    -  Primary    Sciatica associated with disorder of lumbar spine, left          assessment #1 degenerative disc disease with L5 radiculitis left side greater than right #2 facet arthropathy #3 myofascial low back  pain   Plan: #1 The patient is to return to clinic at next available date within the next day or 2 for an epidural steroid as described to him today in full detail. All his questions been answered risks and benefits reviewed. He did well with this in the past and I think it's reasonable to proceed with a repeat plan of epidural steroids management. #2 he is also a candidate for possible facet block #3 continued back stretching and strengthening exercises with return to clinic at next available date.  Outpatient Encounter Prescriptions as of 04/19/2015  Medication Sig  . FINASTERIDE PO Take 5 mg by mouth every morning.  . Omega-3 Fatty Acids (FISH OIL) 1000 MG CPDR Take 1,000  mg by mouth every morning.  Marland Kitchen. omeprazole (PRILOSEC) 20 MG capsule Take 20 mg by mouth every morning.  Marland Kitchen. dextromethorphan-guaiFENesin (ROBITUSSIN-DM) 10-100 MG/5ML liquid Take by mouth every 4 (four) hours as needed for cough.   No facility-administered encounter medications on file as of 04/19/2015.    Follow-up: Return in about 2 days (around 04/21/2015) for LESI at 230 pm, procedure.

## 2015-05-16 ENCOUNTER — Telehealth: Payer: Self-pay | Admitting: Anesthesiology

## 2015-05-16 ENCOUNTER — Ambulatory Visit: Payer: BLUE CROSS/BLUE SHIELD | Admitting: Anesthesiology

## 2015-05-16 NOTE — Telephone Encounter (Signed)
Had to cancel appt due to ins needs med refill / please call to pharmacy / has appt Dec 6 at 2pm

## 2015-06-07 ENCOUNTER — Ambulatory Visit: Payer: BLUE CROSS/BLUE SHIELD | Attending: Anesthesiology | Admitting: Anesthesiology

## 2015-06-07 ENCOUNTER — Encounter: Payer: Self-pay | Admitting: Anesthesiology

## 2015-06-07 ENCOUNTER — Ambulatory Visit: Payer: BLUE CROSS/BLUE SHIELD | Admitting: Anesthesiology

## 2015-06-07 VITALS — BP 157/82 | HR 58 | Temp 98.4°F | Resp 16 | Ht 69.0 in | Wt 195.0 lb

## 2015-06-07 DIAGNOSIS — M5136 Other intervertebral disc degeneration, lumbar region: Secondary | ICD-10-CM | POA: Insufficient documentation

## 2015-06-07 DIAGNOSIS — M5431 Sciatica, right side: Secondary | ICD-10-CM | POA: Insufficient documentation

## 2015-06-07 DIAGNOSIS — M1288 Other specific arthropathies, not elsewhere classified, other specified site: Secondary | ICD-10-CM | POA: Diagnosis not present

## 2015-06-07 DIAGNOSIS — M5432 Sciatica, left side: Secondary | ICD-10-CM | POA: Insufficient documentation

## 2015-06-07 DIAGNOSIS — M47816 Spondylosis without myelopathy or radiculopathy, lumbar region: Secondary | ICD-10-CM

## 2015-06-07 DIAGNOSIS — M545 Low back pain: Secondary | ICD-10-CM | POA: Diagnosis present

## 2015-06-07 MED ORDER — IOHEXOL 180 MG/ML  SOLN
INTRAMUSCULAR | Status: AC
  Administered 2015-06-07: 15:00:00
  Filled 2015-06-07: qty 20

## 2015-06-07 MED ORDER — TRIAMCINOLONE ACETONIDE 40 MG/ML IJ SUSP
INTRAMUSCULAR | Status: AC
  Administered 2015-06-07: 15:00:00
  Filled 2015-06-07: qty 1

## 2015-06-07 MED ORDER — LIDOCAINE HCL (PF) 1 % IJ SOLN
INTRAMUSCULAR | Status: AC
  Administered 2015-06-07: 15:00:00
  Filled 2015-06-07: qty 5

## 2015-06-07 MED ORDER — ROPIVACAINE HCL 2 MG/ML IJ SOLN
INTRAMUSCULAR | Status: AC
  Administered 2015-06-07: 15:00:00
  Filled 2015-06-07: qty 10

## 2015-06-07 MED ORDER — SODIUM CHLORIDE 0.9 % IJ SOLN
INTRAMUSCULAR | Status: AC
  Administered 2015-06-07: 15:00:00
  Filled 2015-06-07: qty 10

## 2015-06-07 NOTE — Patient Instructions (Addendum)
GENERAL RISKS AND COMPLICATIONS  What are the risk, side effects and possible complications? Generally speaking, most procedures are safe.  However, with any procedure there are risks, side effects, and the possibility of complications.  The risks and complications are dependent upon the sites that are lesioned, or the type of nerve block to be performed.  The closer the procedure is to the spine, the more serious the risks are.  Great care is taken when placing the radio frequency needles, block needles or lesioning probes, but sometimes complications can occur. 1. Infection: Any time there is an injection through the skin, there is a risk of infection.  This is why sterile conditions are used for these blocks.  There are four possible types of infection. 1. Localized skin infection. 2. Central Nervous System Infection-This can be in the form of Meningitis, which can be deadly. 3. Epidural Infections-This can be in the form of an epidural abscess, which can cause pressure inside of the spine, causing compression of the spinal cord with subsequent paralysis. This would require an emergency surgery to decompress, and there are no guarantees that the patient would recover from the paralysis. 4. Discitis-This is an infection of the intervertebral discs.  It occurs in about 1% of discography procedures.  It is difficult to treat and it may lead to surgery.        2. Pain: the needles have to go through skin and soft tissues, will cause soreness.       3. Damage to internal structures:  The nerves to be lesioned may be near blood vessels or    other nerves which can be potentially damaged.       4. Bleeding: Bleeding is more common if the patient is taking blood thinners such as  aspirin, Coumadin, Ticiid, Plavix, etc., or if he/she have some genetic predisposition  such as hemophilia. Bleeding into the spinal canal can cause compression of the spinal  cord with subsequent paralysis.  This would require an  emergency surgery to  decompress and there are no guarantees that the patient would recover from the  paralysis.       5. Pneumothorax:  Puncturing of a lung is a possibility, every time a needle is introduced in  the area of the chest or upper back.  Pneumothorax refers to free air around the  collapsed lung(s), inside of the thoracic cavity (chest cavity).  Another two possible  complications related to a similar event would include: Hemothorax and Chylothorax.   These are variations of the Pneumothorax, where instead of air around the collapsed  lung(s), you may have blood or chyle, respectively.       6. Spinal headaches: They may occur with any procedures in the area of the spine.       7. Persistent CSF (Cerebro-Spinal Fluid) leakage: This is a rare problem, but may occur  with prolonged intrathecal or epidural catheters either due to the formation of a fistulous  track or a dural tear.       8. Nerve damage: By working so close to the spinal cord, there is always a possibility of  nerve damage, which could be as serious as a permanent spinal cord injury with  paralysis.       9. Death:  Although rare, severe deadly allergic reactions known as "Anaphylactic  reaction" can occur to any of the medications used.      10. Worsening of the symptoms:  We can always make thing worse.    What are the chances of something like this happening? Chances of any of this occuring are extremely low.  By statistics, you have more of a chance of getting killed in a motor vehicle accident: while driving to the hospital than any of the above occurring .  Nevertheless, you should be aware that they are possibilities.  In general, it is similar to taking a shower.  Everybody knows that you can slip, hit your head and get killed.  Does that mean that you should not shower again?  Nevertheless always keep in mind that statistics do not mean anything if you happen to be on the wrong side of them.  Even if a procedure has a 1  (one) in a 1,000,000 (million) chance of going wrong, it you happen to be that one..Also, keep in mind that by statistics, you have more of a chance of having something go wrong when taking medications.  Who should not have this procedure? If you are on a blood thinning medication (e.g. Coumadin, Plavix, see list of "Blood Thinners"), or if you have an active infection going on, you should not have the procedure.  If you are taking any blood thinners, please inform your physician.  How should I prepare for this procedure?  Do not eat or drink anything at least six hours prior to the procedure.  Bring a driver with you .  It cannot be a taxi.  Come accompanied by an adult that can drive you back, and that is strong enough to help you if your legs get weak or numb from the local anesthetic.  Take all of your medicines the morning of the procedure with just enough water to swallow them.  If you have diabetes, make sure that you are scheduled to have your procedure done first thing in the morning, whenever possible.  If you have diabetes, take only half of your insulin dose and notify our nurse that you have done so as soon as you arrive at the clinic.  If you are diabetic, but only take blood sugar pills (oral hypoglycemic), then do not take them on the morning of your procedure.  You may take them after you have had the procedure.  Do not take aspirin or any aspirin-containing medications, at least eleven (11) days prior to the procedure.  They may prolong bleeding.  Wear loose fitting clothing that may be easy to take off and that you would not mind if it got stained with Betadine or blood.  Do not wear any jewelry or perfume  Remove any nail coloring.  It will interfere with some of our monitoring equipment.  NOTE: Remember that this is not meant to be interpreted as a complete list of all possible complications.  Unforeseen problems may occur.  BLOOD THINNERS The following drugs  contain aspirin or other products, which can cause increased bleeding during surgery and should not be taken for 2 weeks prior to and 1 week after surgery.  If you should need take something for relief of minor pain, you may take acetaminophen which is found in Tylenol,m Datril, Anacin-3 and Panadol. It is not blood thinner. The products listed below are.  Do not take any of the products listed below in addition to any listed on your instruction sheet.  A.P.C or A.P.C with Codeine Codeine Phosphate Capsules #3 Ibuprofen Ridaura  ABC compound Congesprin Imuran rimadil  Advil Cope Indocin Robaxisal  Alka-Seltzer Effervescent Pain Reliever and Antacid Coricidin or Coricidin-D  Indomethacin Rufen    Alka-Seltzer plus Cold Medicine Cosprin Ketoprofen S-A-C Tablets  Anacin Analgesic Tablets or Capsules Coumadin Korlgesic Salflex  Anacin Extra Strength Analgesic tablets or capsules CP-2 Tablets Lanoril Salicylate  Anaprox Cuprimine Capsules Levenox Salocol  Anexsia-D Dalteparin Magan Salsalate  Anodynos Darvon compound Magnesium Salicylate Sine-off  Ansaid Dasin Capsules Magsal Sodium Salicylate  Anturane Depen Capsules Marnal Soma  APF Arthritis pain formula Dewitt's Pills Measurin Stanback  Argesic Dia-Gesic Meclofenamic Sulfinpyrazone  Arthritis Bayer Timed Release Aspirin Diclofenac Meclomen Sulindac  Arthritis pain formula Anacin Dicumarol Medipren Supac  Analgesic (Safety coated) Arthralgen Diffunasal Mefanamic Suprofen  Arthritis Strength Bufferin Dihydrocodeine Mepro Compound Suprol  Arthropan liquid Dopirydamole Methcarbomol with Aspirin Synalgos  ASA tablets/Enseals Disalcid Micrainin Tagament  Ascriptin Doan's Midol Talwin  Ascriptin A/D Dolene Mobidin Tanderil  Ascriptin Extra Strength Dolobid Moblgesic Ticlid  Ascriptin with Codeine Doloprin or Doloprin with Codeine Momentum Tolectin  Asperbuf Duoprin Mono-gesic Trendar  Aspergum Duradyne Motrin or Motrin IB Triminicin  Aspirin  plain, buffered or enteric coated Durasal Myochrisine Trigesic  Aspirin Suppositories Easprin Nalfon Trillsate  Aspirin with Codeine Ecotrin Regular or Extra Strength Naprosyn Uracel  Atromid-S Efficin Naproxen Ursinus  Auranofin Capsules Elmiron Neocylate Vanquish  Axotal Emagrin Norgesic Verin  Azathioprine Empirin or Empirin with Codeine Normiflo Vitamin E  Azolid Emprazil Nuprin Voltaren  Bayer Aspirin plain, buffered or children's or timed BC Tablets or powders Encaprin Orgaran Warfarin Sodium  Buff-a-Comp Enoxaparin Orudis Zorpin  Buff-a-Comp with Codeine Equegesic Os-Cal-Gesic   Buffaprin Excedrin plain, buffered or Extra Strength Oxalid   Bufferin Arthritis Strength Feldene Oxphenbutazone   Bufferin plain or Extra Strength Feldene Capsules Oxycodone with Aspirin   Bufferin with Codeine Fenoprofen Fenoprofen Pabalate or Pabalate-SF   Buffets II Flogesic Panagesic   Buffinol plain or Extra Strength Florinal or Florinal with Codeine Panwarfarin   Buf-Tabs Flurbiprofen Penicillamine   Butalbital Compound Four-way cold tablets Penicillin   Butazolidin Fragmin Pepto-Bismol   Carbenicillin Geminisyn Percodan   Carna Arthritis Reliever Geopen Persantine   Carprofen Gold's salt Persistin   Chloramphenicol Goody's Phenylbutazone   Chloromycetin Haltrain Piroxlcam   Clmetidine heparin Plaquenil   Cllnoril Hyco-pap Ponstel   Clofibrate Hydroxy chloroquine Propoxyphen         Before stopping any of these medications, be sure to consult the physician who ordered them.  Some, such as Coumadin (Warfarin) are ordered to prevent or treat serious conditions such as "deep thrombosis", "pumonary embolisms", and other heart problems.  The amount of time that you may need off of the medication may also vary with the medication and the reason for which you were taking it.  If you are taking any of these medications, please make sure you notify your pain physician before you undergo any  procedures.  Epidural Steroid Injection Patient Information  Description: The epidural space surrounds the nerves as they exit the spinal cord.  In some patients, the nerves can be compressed and inflamed by a bulging disc or a tight spinal canal (spinal stenosis).  By injecting steroids into the epidural space, we can bring irritated nerves into direct contact with a potentially helpful medication.  These steroids act directly on the irritated nerves and can reduce swelling and inflammation which often leads to decreased pain.  Epidural steroids may be injected anywhere along the spine and from the neck to the low back depending upon the location of your pain.   After numbing the skin with local anesthetic (like Novocaine), a small needle is passed into the epidural space slowly.  You  may experience a sensation of pressure while this is being done.  The entire block usually last less than 10 minutes.  Conditions which may be treated by epidural steroids:   Low back and leg pain  Neck and arm pain  Spinal stenosis  Post-laminectomy syndrome  Herpes zoster (shingles) pain  Pain from compression fractures  Preparation for the injection:  1. Do not eat any solid food or dairy products within 6 hours of your appointment.  2. You may drink clear liquids up to 2 hours before appointment.  Clear liquids include water, black coffee, juice or soda.  No milk or cream please. 3. You may take your regular medication, including pain medications, with a sip of water before your appointment  Diabetics should hold regular insulin (if taken separately) and take 1/2 normal NPH dos the morning of the procedure.  Carry some sugar containing items with you to your appointment. 4. A driver must accompany you and be prepared to drive you home after your procedure.  5. Bring all your current medications with your. 6. An IV may be inserted and sedation may be given at the discretion of the physician.   7. A  blood pressure cuff, EKG and other monitors will often be applied during the procedure.  Some patients may need to have extra oxygen administered for a short period. 8. You will be asked to provide medical information, including your allergies, prior to the procedure.  We must know immediately if you are taking blood thinners (like Coumadin/Warfarin)  Or if you are allergic to IV iodine contrast (dye). We must know if you could possible be pregnant.  Possible side-effects:  Bleeding from needle site  Infection (rare, may require surgery)  Nerve injury (rare)  Numbness & tingling (temporary)  Difficulty urinating (rare, temporary)  Spinal headache ( a headache worse with upright posture)  Light -headedness (temporary)  Pain at injection site (several days)  Decreased blood pressure (temporary)  Weakness in arm/leg (temporary)  Pressure sensation in back/neck (temporary)  Call if you experience:  Fever/chills associated with headache or increased back/neck pain.  Headache worsened by an upright position.  New onset weakness or numbness of an extremity below the injection site  Hives or difficulty breathing (go to the emergency room)  Inflammation or drainage at the infection site  Severe back/neck pain  Any new symptoms which are concerning to you  Please note:  Although the local anesthetic injected can often make your back or neck feel good for several hours after the injection, the pain will likely return.  It takes 3-7 days for steroids to work in the epidural space.  You may not notice any pain relief for at least that one week.  If effective, we will often do a series of three injections spaced 3-6 weeks apart to maximally decrease your pain.  After the initial series, we generally will wait several months before considering a repeat injection of the same type.  If you have any questions, please call [phone] [facility]  Facet Blocks Patient Information  Description: The facets are joints in the spine between the vertebrae.  Like any joints in the body, facets can become irritated and painful.  Arthritis can also effect the facets.  By injecting steroids and local anesthetic in and around these joints, we can temporarily block the nerve supply to them.  Steroids act directly on irritated nerves and tissues to reduce selling and inflammation which often  leads to decreased pain.  Facet blocks may be done anywhere along the spine from the neck to the low back depending upon the location of your pain.   After numbing the skin with local anesthetic (like Novocaine), a small needle is passed onto the facet joints under x-ray guidance.  You may experience a sensation of pressure while this is being done.  The entire block usually lasts about 15-25 minutes.   Conditions which may be treated by facet blocks:   Low back/buttock pain  Neck/shoulder pain  Certain types of headaches  Preparation for the injection:  12. Do not eat any solid food or dairy products within 6 hours of your appointment. 13. You may drink clear liquid up to 2 hours before appointment.  Clear liquids include water, black coffee, juice or soda.  No milk or cream please. 14. You may take your regular medication, including pain medications, with a sip of water before your appointment.  Diabetics should hold regular insulin (if taken separately) and take 1/2 normal NPH dose the morning of the procedure.  Carry some sugar containing items with you to your appointment. 15. A driver must accompany you and be prepared to drive you home after your procedure. 16. Bring all your current medications with you. 17. An IV may be inserted and sedation may be given at the discretion of the physician. 18. A blood pressure cuff, EKG and other monitors will often be applied during the procedure.  Some patients may need to have extra oxygen administered for  a short period. 19. You will be asked to provide medical information, including your allergies and medications, prior to the procedure.  We must know immediately if you are taking blood thinners (like Coumadin/Warfarin) or if you are allergic to IV iodine contrast (dye).  We must know if you could possible be pregnant.  Possible side-effects:   Bleeding from needle site  Infection (rare, may require surgery)  Nerve injury (rare)  Numbness & tingling (temporary)  Difficulty urinating (rare, temporary)  Spinal headache (a headache worse with upright posture)  Light-headedness (temporary)  Pain at injection site (serveral days)  Decreased blood pressure (rare, temporary)  Weakness in arm/leg (temporary)  Pressure sensation in back/neck (temporary)   Call if you experience:   Fever/chills associated with headache or increased back/neck pain  Headache worsened by an upright position  New onset, weakness or numbness of an extremity below the injection site  Hives or difficulty breathing (go to the emergency room)  Inflammation or drainage at the injection site(s)  Severe back/neck pain greater than usual  New symptoms which are concerning to you  Please note:  Although the local anesthetic injected can often make your back or neck feel good for several hours after the injection, the pain will likely return. It takes 3-7 days for steroids to work.  You may not notice any pain relief for at least one week.  If effective, we will often do a series of 2-3 injections spaced 3-6 weeks apart to maximally decrease your pain.  After the initial series, you may be a candidate for a more permanent nerve block of the facets.  If you have any questions, please call #[phone] [facility]  Pain Management Discharge Instructions  General Discharge Instructions :  If you need to reach your doctor call: Monday-Friday 8:00 am - 4:00 pm at  [phone] or toll free [phone].  After clinic hours [phone] to have operator reach doctor.  Bring all of your medication bottles to all your appointments in the pain clinic.  To cancel or reschedule your appointment with Pain Management please remember to call 24 hours in advance to avoid a fee.  Refer to the educational materials which you have been given on: General Risks, I had my Procedure. Discharge Instructions, Post Sedation.  Post Procedure Instructions:  The drugs you were given will stay in your system until tomorrow, so for the next 24 hours you should not drive, make any legal decisions or drink any alcoholic beverages.  You may eat anything you prefer, but it is better to start with liquids then soups and crackers, and gradually work up to solid foods.  Please notify your doctor immediately if you have any unusual bleeding, trouble breathing or pain that is not related to your normal pain.  Depending on the type of procedure that was done, some parts of your body may feel week and/or numb.  This usually clears up by tonight or the next day.  Walk with the use of an assistive device or accompanied by an adult for the 24 hours.  You may use ice on the affected area for the first 24 hours.  Put ice in a Ziploc bag and cover with a towel and place against area 15 minutes on 15 minutes off.  You may switch to heat after 24 hours.

## 2015-06-08 ENCOUNTER — Telehealth: Payer: Self-pay | Admitting: *Deleted

## 2015-06-08 NOTE — Telephone Encounter (Signed)
Denies complications post procedure. 

## 2015-06-09 NOTE — Progress Notes (Signed)
PROCEDURE PERFORMED: Lumbar epidural steroid injection at L5-S1 under fluoroscopic guidance without sedation  CC:  Left lower back and hip pain with radiation into the buttocks and left calf   HPI: Mr. Keith Fuller presents for reevaluation last seen approximately 2 months ago. He had an epidural injection at that time and did quite well. This knocked out the majority of his left lower back pain and last approximately 6 weeks to had some recurrence. At present he is experiencing some left lower back pain with radiation into the left buttock region. His leg pain is significantly better still. Otherwise he is in his usual state of health with no significant changes in bowel bladder function or strength. He desires to proceed with a repeat injection today.   Physical Exam: Pupils are equally round and reactive to light extraocular muscles are intact  Heart is regular rate and rhythm  Lungs are clear to auscultation Musculoskeletal: He has some paraspinous muscle tenderness but no overt trigger points. He still has some slight low back pain with extension of the left leg but otherwise this is stable with good strength.   Assessment:  1. 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  Facet arthopathy  3. Myofacial LBP  PLAN:   1. LESI today with R/B fully reviewed and questions answered 2.Medications: We will continue presently prescribed medications. 3, RTC in 6 weeks for repeat LESI vs. Possible facet block     Procedure:  LESI:  NOTE: The risks, benefits, and expectations of the procedure have been discussed and explained to the patient who was understanding and in agreement with suggested treatment plan. No guarantees were made.  DESCRIPTION OF PROCEDURE: Lumbar epidural steroid injection with IV Versed, EKG, blood pressure, pulse, and pulse oximetry monitoring. The procedure was performed with the patient in the prone position under fluoroscopic guidance. A local  anesthetic skin wheal of 1.5% plain lidocaine was performed at the appropriate site L5S1 after fluoroscopic identifictation  Using strict aseptic technique, I then advanced an 18-gauge Tuohy epidural needle in the midline via loss-of-resistance  Technique. There was negative aspiration for negative aspiration for heme or  CSF.  4 cc of Omnipaque were injected to confirm epidurals placement and I also confirmed position with both AP and Lateral fluoroscan.  A total of 5 mL of Preservative-Free normal saline with 40 mg of Kenalog and 1cc Ropicaine 0.2 percent was injected incrementally via the  epidurally placed needle. Needle removed. The patient tolerated the injection well. He was convalesced and discharged to home in stable condition without sequalae.   @Keith Fuller  Pernell DupreAdams, MD@

## 2015-07-18 ENCOUNTER — Telehealth: Payer: Self-pay

## 2015-07-18 NOTE — Telephone Encounter (Signed)
Pt wants to know does he have to have sedation

## 2015-07-18 NOTE — Telephone Encounter (Signed)
Explained what a facet block is, informed that he does not have to have sedation. Informed that most patients do elect for sedation for this procedure,as it involves more needlesticks.

## 2015-07-19 ENCOUNTER — Ambulatory Visit: Payer: BLUE CROSS/BLUE SHIELD | Admitting: Anesthesiology

## 2015-08-10 ENCOUNTER — Ambulatory Visit: Payer: BLUE CROSS/BLUE SHIELD | Attending: Pain Medicine | Admitting: Pain Medicine

## 2015-08-10 ENCOUNTER — Encounter: Payer: Self-pay | Admitting: Pain Medicine

## 2015-08-10 VITALS — BP 157/83 | HR 68 | Temp 97.9°F | Resp 16 | Ht 69.0 in | Wt 193.0 lb

## 2015-08-10 DIAGNOSIS — M545 Low back pain, unspecified: Secondary | ICD-10-CM

## 2015-08-10 DIAGNOSIS — R937 Abnormal findings on diagnostic imaging of other parts of musculoskeletal system: Secondary | ICD-10-CM | POA: Insufficient documentation

## 2015-08-10 DIAGNOSIS — M79604 Pain in right leg: Secondary | ICD-10-CM

## 2015-08-10 DIAGNOSIS — M79606 Pain in leg, unspecified: Secondary | ICD-10-CM | POA: Diagnosis not present

## 2015-08-10 DIAGNOSIS — M4316 Spondylolisthesis, lumbar region: Secondary | ICD-10-CM | POA: Diagnosis not present

## 2015-08-10 DIAGNOSIS — M48061 Spinal stenosis, lumbar region without neurogenic claudication: Secondary | ICD-10-CM

## 2015-08-10 DIAGNOSIS — M5441 Lumbago with sciatica, right side: Secondary | ICD-10-CM

## 2015-08-10 DIAGNOSIS — M489 Spondylopathy, unspecified: Secondary | ICD-10-CM

## 2015-08-10 DIAGNOSIS — M722 Plantar fascial fibromatosis: Secondary | ICD-10-CM | POA: Diagnosis not present

## 2015-08-10 DIAGNOSIS — M791 Myalgia: Secondary | ICD-10-CM | POA: Diagnosis not present

## 2015-08-10 DIAGNOSIS — M5136 Other intervertebral disc degeneration, lumbar region: Secondary | ICD-10-CM | POA: Diagnosis not present

## 2015-08-10 DIAGNOSIS — M549 Dorsalgia, unspecified: Secondary | ICD-10-CM | POA: Diagnosis present

## 2015-08-10 DIAGNOSIS — M4310 Spondylolisthesis, site unspecified: Secondary | ICD-10-CM | POA: Insufficient documentation

## 2015-08-10 DIAGNOSIS — M7918 Myalgia, other site: Secondary | ICD-10-CM

## 2015-08-10 DIAGNOSIS — M47816 Spondylosis without myelopathy or radiculopathy, lumbar region: Secondary | ICD-10-CM | POA: Diagnosis not present

## 2015-08-10 DIAGNOSIS — N139 Obstructive and reflux uropathy, unspecified: Secondary | ICD-10-CM | POA: Diagnosis not present

## 2015-08-10 DIAGNOSIS — M79605 Pain in left leg: Secondary | ICD-10-CM

## 2015-08-10 DIAGNOSIS — Z87891 Personal history of nicotine dependence: Secondary | ICD-10-CM | POA: Diagnosis not present

## 2015-08-10 DIAGNOSIS — M4806 Spinal stenosis, lumbar region: Secondary | ICD-10-CM

## 2015-08-10 DIAGNOSIS — G8929 Other chronic pain: Secondary | ICD-10-CM | POA: Diagnosis not present

## 2015-08-10 DIAGNOSIS — M5442 Lumbago with sciatica, left side: Secondary | ICD-10-CM

## 2015-08-10 MED ORDER — CYCLOBENZAPRINE HCL 10 MG PO TABS: 10.0000 mg | ORAL_TABLET | Freq: Three times a day (TID) | ORAL | Status: AC | PRN

## 2015-08-10 MED ORDER — KETOROLAC TROMETHAMINE 60 MG/2ML IM SOLN
60.0000 mg | Freq: Once | INTRAMUSCULAR | Status: AC
  Administered 2015-08-10: 60 mg via INTRAMUSCULAR

## 2015-08-10 MED ORDER — ORPHENADRINE CITRATE 30 MG/ML IJ SOLN
60.0000 mg | Freq: Once | INTRAMUSCULAR | Status: AC
  Administered 2015-08-10: 60 mg via INTRAMUSCULAR

## 2015-08-10 MED ORDER — KETOROLAC TROMETHAMINE 60 MG/2ML IM SOLN
INTRAMUSCULAR | Status: AC
  Administered 2015-08-10: 60 mg via INTRAMUSCULAR
  Filled 2015-08-10: qty 2

## 2015-08-10 MED ORDER — ORPHENADRINE CITRATE 30 MG/ML IJ SOLN
INTRAMUSCULAR | Status: AC
  Administered 2015-08-10: 60 mg via INTRAMUSCULAR
  Filled 2015-08-10: qty 2

## 2015-08-10 NOTE — Progress Notes (Signed)
Patient's Name: Keith Fuller MRN: [id number] DOB: 07/27/1950 DOS: 08/10/2015  Primary Reason(s) for Visit: Initial Patient Evaluation CC: Back Pain   HPI  Keith Fuller is a 65 y.o. year old, male patient, who returns today as an established patient. He has Benign prostatic hyperplasia with urinary obstruction; Degeneration of intervertebral disc of lumbar region; Elevated prostate specific antigen (PSA); Acute low back pain; Lumbar spondylosis; Chronic low back pain (Location of Primary Source of Pain) (Bilateral) (L>R); Lumbar facet syndrome (Bilateral) (L>R); Chronic pain; Musculoskeletal pain; Myofascial pain syndrome (lumbar region) (Bilateral) (L>R); Chronic lower extremity pain (Location of Secondary source of pain) (Referred Pain) (Bilateral) (L>R); Plantar fasciitis (Bilateral); Abnormal CT scan, lumbar spine; Abnormal x-ray of lumbar spine; Grade 1 Retrolisthesis of L2 over L3; Lumbar central spinal stenosis (L2-3, L3-4); 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 His primarily concern today is the Back Pain   The patient returns to the clinics today for an initial evaluation after last time being seen in 2015. At that time I had done some intra-articular knee injections with Hyalgan as well as a right-sided L3-4 lumbar epidural steroid injection under fluoroscopic guidance and IV sedation. This provided the patient with great relief of the pain that lasted over a year. When the pain return, the patient came in to see me, but I happen to have been transitioning between jobs and he went to see Keith Fuller. Keith Fuller dated 2 lumbar epidural steroid injections and apparently there were some problems with insurance coverage. The patient indicates that he did not get as much relief as when I did the injection and he comes in to see what I can do for him since I to participate on his current  insurance.  Reported Pain Score: 4  (no pain medication at this time.) Reported level is inconsistent with clinical obrservations. Pain Type: Chronic pain Pain Location: Back Pain Orientation: Lower Pain Descriptors / Indicators: Dull, Aching (upon movement, pain becomes sharp and electric.  difficulty rising from sitting or lying) Pain Frequency: Constant  Date of Last Visit: 04/07/14 Service Provided on Last Visit: Med Refill (patient has been seen by Dr Pernell Fuller since seeing you.)  Pharmacotherapy  Medication(s): He is not taking any opioids. In addition, he indicates that he will not be using any opioids in the future.  Lab Work: Illicit Drugs No results found for: THCU, COCAINSCRNUR, PCPSCRNUR, MDMA, AMPHETMU, METHADONE, ETOH  Inflammation Markers No results found for: ESRSEDRATE, CRP  Renal Function Lab Results  Component Value Date   BUN 17 02/26/2014   CREATININE 1.24 02/26/2014   GFRAA >60 02/26/2014   GFRNONAA >60 02/26/2014    Hepatic Function No results found for: AST, ALT, ALBUMIN  Electrolytes Lab Results  Component Value Date   NA 135* 02/26/2014   K 4.1 02/26/2014   CL 102 02/26/2014   CALCIUM 7.9* 02/26/2014    Allergies  Keith Fuller is allergic to penicillins.  Meds  The patient has a current medication list which includes the following prescription(s): finasteride, fish oil, omeprazole, and cyclobenzaprine.  Current Outpatient Prescriptions on File Prior to Visit  Medication Sig  . FINASTERIDE PO Take 5 mg by mouth every morning.  . Omega-3 Fatty Acids (FISH OIL) 1000 MG CPDR Take 1,000 mg by mouth every morning.  Marland Kitchen omeprazole (PRILOSEC) 20 MG capsule Take 20 mg by mouth every morning.   No current facility-administered medications on  file prior to visit.    ROS  Constitutional: Afebrile, no chills, well hydrated and well nourished Gastrointestinal: negative Musculoskeletal:negative Neurological: negative Behavioral/Psych:  negative  PFSH  Medical:  Keith Fuller  has a past medical history of Allergy and Hepatitis C. Family: family history includes Arthritis in his mother; Cancer in his father; Stroke in his father. Surgical:  has past surgical history that includes Joint replacement; Hernia repair; and Prostate surgery. Tobacco:  reports that he has quit smoking. He does not have any smokeless tobacco history on file. Alcohol:  reports that he drinks alcohol. Drug:  reports that he does not use illicit drugs.  Physical Exam  Vitals:  Today's Vitals   08/10/15 1506  BP: 157/83  Pulse: 68  Temp: 97.9 F (36.6 C)  TempSrc: Oral  Resp: 16  Height:  (1.753 m)  Weight: 193 lb (87.544 kg)  SpO2: 98%  PainSc: 4     Calculated BMI: Body mass index is 28.49 kg/(m^2).  General appearance: alert, cooperative, appears stated age, mild distress and very talkative Eyes: PERLA Respiratory: No evidence respiratory distress, no audible rales or ronchi and no use of accessory muscles of respiration  Cervical Spine Inspection: Normal anatomy Alignment: Symetrical ROM: Adequate   Upper Extremities Inspection: No gross anomalies detected ROM: Adequate Sensory: Normal Motor: Unremarkable  Thoracic Spine Inspection: No gross anomalies detected Alignment: Symetrical ROM: Adequate  Lumbar Spine Inspection: No gross anomalies detected Alignment: Symetrical ROM: Decreased Palpation: Tender Provocative Tests:  Lumbar Hyperextension and rotation test:  Positive bilaterally. Patrick's Maneuver: Negative for SI joint or hip joint pain. Gait: WNL  Lower Extremities Inspection: No gross anomalies detected ROM: Adequate Sensory:  Normal Motor: Unremarkable  Toe walk (S1): WNL  Heal walk (L5): WNL DTR:  Patellar (L4): WNL Achilles (S1): WNL  Assessment & Plan  Primary Diagnosis & Pertinent Problem List: The primary encounter diagnosis was Acute low back pain. 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  Visit Diagnosis: 1. Acute low back pain   2. Lumbar spondylosis, unspecified spinal osteoarthritis   3. Chronic low back pain (Location of Primary Source of Pain) (Bilateral) (L>R)   4. Lumbar facet syndrome (Bilateral) (L>R)   5. Chronic pain   6. Musculoskeletal pain   7. Myofascial pain syndrome (lumbar region) (Bilateral) (L>R)   8. Chronic pain of lower extremity, unspecified laterality   9. Plantar fasciitis (Bilateral)   10. Abnormal CT scan, lumbar spine   11. Abnormal x-ray of lumbar spine   12. Grade 1 Retrolisthesis of L2 over L3   13. Lumbar central spinal stenosis (L2-3, L3-4)   14. 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 Degeneration of intervertebral disc of lumbar region     Assessment: No problem-specific assessment & plan notes found for this encounter.   Plan of Care  Pharmacotherapy (Medications Ordered): Meds ordered this  encounter  Medications  . orphenadrine (NORFLEX) 30 MG/ML injection    Sig:     Jarold Motto, Delores: cabinet override  . ketorolac (TORADOL) 60 MG/2ML injection    Sig:     Jarold Motto, Delores: cabinet override  . orphenadrine (NORFLEX) injection 60 mg    Sig:   . ketorolac  (TORADOL) injection 60 mg    Sig:   . cyclobenzaprine (FLEXERIL) 10 MG tablet    Sig: Take 1 tablet (10 mg total) by mouth 3 (three) times daily as needed for muscle spasms.    Dispense:  90 tablet    Refill:  2    Do not place this medication, or any other prescription from our practice, on "Automatic Refill". Patient may have prescription filled one day early if pharmacy is closed on scheduled refill date.    Lab-work & Procedure Ordered: Orders Placed This Encounter  Procedures  . LUMBAR FACET(MEDIAL BRANCH NERVE BLOCK) MBNB    Standing Status: Future     Number of Occurrences:      Standing Expiration Date: 08/09/2016    Scheduling Instructions:     Side: Bilateral     Level: L2, L3, L4, L5, & S1 Medial Branch Nerve     Sedation: With Sedation.     Timeframe: ASAP    Order Specific Question:  Where will this procedure be performed?    Answer:  [facility]  . DG Lumbar Spine Complete W/Bend    Standing Status: Future     Number of Occurrences:      Standing Expiration Date: 08/09/2016    Scheduling Instructions:     Please include flexion and extension views and report any spinal instability (>4 mm displacement of any spondylolisthesis). If present, please report any spondylolisthesis grade, as well as displacement in millimeters.    Order Specific Question:  Reason for Exam (SYMPTOM  OR DIAGNOSIS REQUIRED)    Answer:  Low back pain    Order Specific Question:  Preferred imaging location?    Answer:  [facility]    Order Specific Question:  Call Results- Best Contact Number?    Answer:  [phone] (Pain Clinic facility) (Dr. Laban Emperor)    Imaging Ordered: DG LUMBAR SPINE COMPLETE W/BEND 6+V  Interventional Therapies: Scheduled: Diagnostic bilateral lumbar facet block under fluoroscopic guidance and IV sedation. PRN Procedures: Left L3-4 lumbar epidural steroid injection under fluoroscopic guidance, no sedation.    Referral(s) or Consult(s): None at this  point.  Medications administered during this visit: We administered orphenadrine, ketorolac, orphenadrine, and ketorolac.  Future Appointments Date Time Provider Department Center  08/11/2015 10:20 AM Delano Metz, MD [facility] None    Primary Care Physician: Lynnea Ferrier, MD Location: [facility] Note by: Sydnee Levans. Laban Emperor, M.D, DABA, DABAPM, DABPM, DABIPP, FIPP

## 2015-08-10 NOTE — Progress Notes (Deleted)
Patient's Name: Keith Fuller MRN: [id number] DOB: 1950-08-23 DOS: 08/10/2015  Primary Reason(s) for Visit: Initial Patient Evaluation CC: Back Pain   HPI  Keith Fuller is a 65 y.o. year old, male patient, who comes today for an initial evaluation. He  does not have a problem list on file.. His primarily concern today is the Back Pain   ***  Today's Pain Score: 4  (no pain medication at this time.) {Blank single:19197::"Reported level of pain is compatible with clinical observation","Clear symptom exaggeration. Reported level of pain is incompatible with clinical observations.","Reported level of pain is incompatible with clinical obrservations. This may be secondary to a possible lack of understanding on how the pain scale works."} Pain Type: Chronic pain Pain Location: Back Pain Orientation: Lower Pain Descriptors / Indicators: Dull, Aching (upon movement, pain becomes sharp and electric.  difficulty rising from sitting or lying) Pain Frequency: Constant  Onset and Duration: {Hx; Onset and Duration:210120511} Cause of pain: {Hx; Cause:210120521} Severity: {Pain Severity:210120502} Timing: {Symptoms; Timing:210120501} Aggravating Factors: {Causes; Aggravating pain factors:210120507} Alleviating Factors: {Causes; Alleviating Factors:210120500} Associated Problems: {Hx; Associated problems:210120515} Quality of Pain: {Hx; Symptom quality or Descriptor:210120531} Previous Examinations or Tests: {Hx; Previous examinations or test:210120529} Previous Treatments: {Hx; Previous Treatment:210120503}  Pharmacotherapy Review  Previously Prescribed Opioids:  Side-effects or Adverse reactions: {Blank single:19197::"Minimal.","Moderate.","Significant","Constipation","Oversedation. This could suggest a drug interaction or accumulation","None reported"} Effectiveness: {Blank single:19197::"None","Described as ineffective and would like to make some changes","Described as relatively  effective but with some room for improvement","Described as relatively effective, allowing for increase in activities of daily living (ADL)"} Onset of action: {Blank single:19197::"Slower than expected. Possible absorption problems","Within expected pharmacological parameters"} Duration of action: {Blank single:19197::"Shorter than expected. This could suggest rapid metabolism or elimination of the pharmacological agent","Longer than pharmacokineticly expected. This could suggest possible elimination problems","Within normal limits for medication"} Peak effect: {Blank single:19197::"Patient describes as insufficient","Some oversedation appears to be evident","Timing and results are as within normal expected parameters"} [place] PMP: {Blank single:19197::"Abnormal. Results discussed with patient","Compliant with practice rules and regulations","Reviewed"} Hospital associated UDS Results: No results found for: THCU, COCAINSCRNUR, PCPSCRNUR, MDMA, AMPHETMU, METHADONE, ETOH UDS Results: {Blank single:19197::"No UDS available, at this time"} UDS Interpretation: {Blank single:19197::"Abnormal results discussed with patient","Patient appears to be compliant with practice rules and regulations","No UDS available, at this time"} Medication Assessment Form: {Blank single:19197::"Discrepancies found between patient's report and information collected","Reviewed. Abnormalities discussed","Reviewed. Patient indicates being compliant with therapy","Not applicable. Initial evaluation. The patient has not received any medications from our practice"} Treatment compliance: {Blank single:19197::"Deficiencies noted and steps taken to remind the patient of the seriousness of adequate therapy compliance","Non-compliant. Steps taken to remind the patient of the seriousness of adequate therapy compliance","Recurrent non-compliance, despite repeated warnings","Compliant","Not applicable. Initial evaluation"} Substance Use Disorder (SUD)  Risk Level: {Blank single:19197::"High","Moderate","Low","Pending results of Medical Psychology Evaluation for SUD"} Pharmacologic Plan: {Blank single:19197::"Adjust therapy: ***","Discontinue therapy","Continue therapy as is","Pending ordered tests and/or consults"}  Intrathecal Pump Therapy: Side-effects or Adverse reactions: {Blank single:19197::"Minimal","Moderate","Significant","Constipation","Oversedation. This could suggest a drug interaction or accumulation","None reported"} Effectiveness: {Blank single:19197::"None","Described as ineffective and would like to make some changes","Described as relatively effective but with some room for improvement","Described as relatively effective, allowing for increase in activities of daily living (ADL)"} Plan: {Blank single:19197::"Pump refill today","Analysis and programming of a 10% increase in rate","Analysis and programming of a 20% increase in rate","Decrease in pump rate","No changes in programming"}  Allergies  Keith Fuller is allergic to penicillins.  Meds  The patient has a current medication list which includes the following prescription(s): finasteride, fish oil, omeprazole,  and cyclobenzaprine. Requested Prescriptions    No prescriptions requested or ordered in this encounter    ROS  Cardiovascular History: {Hx; Cardiovascular History:210120525} Pulmonary or Respiratory History: {Hx; Pumonary and/or Respiratory History:210120523} Neurological History: {Hx; Neurological:210120504} Psychological-Psychiatric History: {Hx; Psychological-Psychiatric History:210120512} Gastrointestinal History: {Hx; Gastrointestinal:210120527} Genitourinary History: {Hx; Genitourinary:210120506} Hematological History: {Hx; Hematological:210120510} Endocrine History: {Hx; Endocrine history:210120509} Rheumatologic History: {Hx; Rheumatological:210120530} Musculoskeletal History: {Hx; Musculoskeletal:210120528} Work History: {Hx; Work  history:210120514}  PFSH  Medical:  Keith Fuller  has a past medical history of Allergy and Hepatitis C. Family: family history includes Arthritis in his mother; Cancer in his father; Stroke in his father. Surgical:  has past surgical history that includes Joint replacement; Hernia repair; and Prostate surgery. Tobacco:  reports that he has quit smoking. He does not have any smokeless tobacco history on file. Alcohol:  reports that he drinks alcohol. Drug:  reports that he does not use illicit drugs.  Physical Exam  Vitals:  Today's Vitals   08/10/15 1506  BP: 157/83  Pulse: 68  Temp: 97.9 F (36.6 C)  TempSrc: Oral  Resp: 16  Height:  (1.753 m)  Weight: 193 lb (87.544 kg)  SpO2: 98%  PainSc: 4     Calculated BMI: Body mass index is 28.49 kg/(m^2).  General appearance: {general exam:16600} Eyes: {Blank single:19197::"Pin-point pupils","PERLA"} Respiratory: {Blank multiple:19196::"No evidence respiratory distress","no audible rales or ronchi","no use of accessory muscles of respiration"}  Cervical Spine Inspection: {Blank single:19197::"Normal anatomy"} Alignment: {Blank single:19197::"Asymetric","Symetrical"} ROM: {Blank single:19197::"Decreased","Full ROM","Grossly normal","Adequate"} Palpation: {Blank single:19197::"Positive","Negative","Tender","Trigger Point","Muscular Atrophy","Non-contributory","WNL"} Provocative Tests: Spurling's Foraminal Stenosis Test: {Blank single:19197::"Positive","Negative","deferred"} Hoffman's Cervical Myelopathy Test: {Blank single:19197::"Positive","Negative","deferred"} Lhermitte Sign (Cord Compression/Myelopathy Test):  {Blank single:19197::"Positive","Negative","deferred"}  Upper Extremities Inspection: {Blank single:19197::"No gross anomalies detected"} ROM: {Blank single:19197::"Decreased","Full ROM","Grossly normal","Adequate"} Sensory: {Blank  single:19197::"Absent","Impaired","Dysesthesias","Allodynia","Anesthesia","Hyperesthesia","Hyperpathia","No sensory abnormalities","Normal"} Motor: {Blank single:19197::"Abnormal","Weakness","Guarding","5/5","Grossly normal","Unremarkable"} Pulses: {Blank single:19197::"Absent","Decreased","Palpable"} DTR:  Biceps (C5): {Blank single:19197::"Decreased","Hyper-reflexic","not tested","WNL"} Brachioradialis (C6): {Blank single:19197::"Decreased","Hyper-reflexic","not tested","WNL"} Triceps (C7): {Blank single:19197::"Decreased","Hyper-reflexic","not tested","WNL"}  Thoracic Spine Inspection: {Blank single:19197::"No gross anomalies detected"} Alignment: {Blank single:19197::"Asymetric","Symetrical"} ROM: {Blank single:19197::"Decreased","Full ROM","Grossly normal","Adequate"} Palpation: {Blank single:19197::"Positive","Negative","Tender","Trigger Point","Muscular Atrophy","Non-contributory","WNL"}  Lumbar Spine Inspection: {Blank single:19197::"No gross anomalies detected"} Alignment: {Blank single:19197::"Asymetric","Symetrical"} ROM: {Blank single:19197::"Decreased","Full ROM","Grossly normal","Adequate"} Palpation: {Blank single:19197::"Positive","Negative","Tender","Trigger Point","Muscular Atrophy","Non-contributory","WNL"} Provocative Tests: Lumbar Hyperextension and rotation test: {Blank single:19197::"Positive","Negative","Equivocal","Unable to perform","Non-contributory","deferred"} Patrick's Maneuver: {Blank single:19197::"Positive","Negative","Equivocal","Unable to perform","Non-contributory","deferred"} Gait: {Blank single:19197::"Antalgic (limping)","Normal","Abnormal","Charlie"Chaplin (due to tibial torsion)","Circumduction gait (due to hemiplegia)","Waddling (Hip pathology)","High stepping gait (foot drop)","Scissor gait (cerebral palsy)","Stiff hip gait (hip ankylosis)","Trendelenburg (unstable hip)","Ataxia","Dystaxia","Hemiataxia","Friedreich's ataxia","Shuffling  gait","Compensatory","Patient comes in today in a wheel chair","WNL"}  Lower Extremities Inspection: {Blank single:19197::"No gross anomalies detected"} ROM: {Blank single:19197::"Decreased","Full ROM","Grossly normal","Adequate"} Sensory: {Blank single:19197::"Absent","Impaired","Dysesthesias","Allodynia","Anesthesia","Hyperesthesia","Hyperpathia","No sensory abnormalities","Normal"} Motor: {Blank single:19197::"Abnormal","Weakness","Guarding","5/5","Grossly normal","Unremarkable"}  Toe walk (S1): {Blank single:19197::"Abnormal","Weakness","Incapable","WNL"}  Heal walk (L5): {Blank single:19197::"Abnormal","Weakness","Incapable","WNL"} Pulses: {Blank single:19197::"Absent","Decreased","Palpable"} DTR:  Patellar (L4): {Blank single:19197::"Decreased","Hyper-reflexic","not tested","WNL"} Achilles (S1): {Blank single:19197::"Decreased","Hyper-reflexic","not tested","WNL"}   Assessment  Primary Diagnosis & Pertinent Problem List: There were no encounter diagnoses.  Visit Diagnosis: No diagnosis found.  Assessment: No problem-specific assessment & plan notes found for this encounter.   Plan of Care  Note: As per protocol, today's visit has been an evaluation only. We have not taken over the patient's controlled substance management.  Pharmacotherapy (Medications Ordered): No orders of the defined types were placed in this encounter.    Lab-work & Procedure Ordered: No orders of the defined types were placed in this encounter.    Imaging Ordered: None  Interventional Therapies: Scheduled: *** PRN Procedures: ***   Referral(s) or Consult(s): ***  Medications administered during this visit: Mr. Mcilhenny does not  currently have medications on file.  No future appointments.  Primary Care Physician: Lynnea Ferrier, MD Location: [facility] Note by: Sydnee Levans Laban Emperor, M.D, DABA, DABAPM, DABPM, DABIPP, FIPP   REASON FOR EXAM:    low back  pain eval herniated disc  pt has a piece of  metal in head COMMENTS:     PROCEDURE: CT  - CT LUMBAR SPINE WO  - Jul 14 2013  7:45AM   CLINICAL DATA:  Low back pain for 2 months. Evaluate herniated disc.  EXAM: CT LUMBAR SPINE WITHOUT CONTRAST  TECHNIQUE: Multidetector CT imaging of the lumbar spine was performed without intravenous contrast administration. Multiplanar CT image reconstructions were also generated. COMPARISON:  Lumbar spine radiographs 06/15/2013  FINDINGS: There is trace retrolisthesis of L2 on L3. Vertebral body heights are preserved without evidence of compression fracture. Small sclerotic foci in the L3 left superior articular process and T12, L2, and L5 vertebral bodies are suggestive of bone islands. The visualized paraspinal soft tissues are unremarkable.  T12-L1:  Negative.  L1-2: Mild to moderate disc space narrowing with anterior spurring. Mild to moderate circumferential disc bulge without spinal canal or neural foraminal stenosis. L2-3: Mild to moderate disc space narrowing with vacuum disc phenomenon and anterior spurring. Mild to moderate circumferential disc bulge flattens the ventral thecal sac with borderline spinal canal narrowing. There is mild right neural foraminal narrowing.  L3-4: Listhesis and mild disc bulge results in mild spinal stenosis with lateral recess narrowing. There is mild to moderate facet arthrosis. No neural foraminal stenosis.  L4-5: Mild disc bulge eccentric to the left results in mild to moderate left lateral recess narrowing and mild left neural foraminal narrowing. Mild facet arthrosis.  L5-S1: Mild disc bulge without spinal canal or neural foraminal stenosis. There is moderate facet arthrosis.  IMPRESSION: Mild to moderate multilevel degenerative disc disease and facet arthrosis as above, with the lateral recess narrowing at L3-4 bilaterally and L4-5 on the left.   Electronically Signed   By: Sebastian Ache    On: 07/14/2013 08:48     Verified By: Prudy Feeler, M.D.,

## 2015-08-10 NOTE — Progress Notes (Signed)
Returning patient here for evaluation of low back pain that radiates into hips.

## 2015-08-10 NOTE — Patient Instructions (Signed)

## 2015-08-11 ENCOUNTER — Ambulatory Visit: Payer: BLUE CROSS/BLUE SHIELD | Attending: Pain Medicine | Admitting: Pain Medicine

## 2015-08-11 ENCOUNTER — Encounter: Payer: Self-pay | Admitting: Pain Medicine

## 2015-08-11 VITALS — BP 130/70 | HR 60 | Temp 98.5°F | Resp 16 | Ht 69.0 in | Wt 193.0 lb

## 2015-08-11 DIAGNOSIS — M4316 Spondylolisthesis, lumbar region: Secondary | ICD-10-CM | POA: Insufficient documentation

## 2015-08-11 DIAGNOSIS — M47816 Spondylosis without myelopathy or radiculopathy, lumbar region: Secondary | ICD-10-CM | POA: Diagnosis not present

## 2015-08-11 DIAGNOSIS — M4806 Spinal stenosis, lumbar region: Secondary | ICD-10-CM | POA: Diagnosis not present

## 2015-08-11 DIAGNOSIS — M5136 Other intervertebral disc degeneration, lumbar region: Secondary | ICD-10-CM | POA: Insufficient documentation

## 2015-08-11 DIAGNOSIS — M545 Low back pain: Secondary | ICD-10-CM | POA: Insufficient documentation

## 2015-08-11 DIAGNOSIS — N400 Enlarged prostate without lower urinary tract symptoms: Secondary | ICD-10-CM | POA: Insufficient documentation

## 2015-08-11 DIAGNOSIS — G8929 Other chronic pain: Secondary | ICD-10-CM | POA: Diagnosis not present

## 2015-08-11 DIAGNOSIS — M549 Dorsalgia, unspecified: Secondary | ICD-10-CM | POA: Diagnosis present

## 2015-08-11 MED ORDER — TRIAMCINOLONE ACETONIDE 40 MG/ML IJ SUSP: 40.0000 mg | Freq: Once | INTRAMUSCULAR | Status: DC

## 2015-08-11 MED ORDER — MELOXICAM 15 MG PO TABS: 15.0000 mg | ORAL_TABLET | Freq: Every day | ORAL | Status: DC

## 2015-08-11 MED ORDER — ROPIVACAINE HCL 2 MG/ML IJ SOLN: 9.0000 mL | Freq: Once | INTRAMUSCULAR | Status: DC

## 2015-08-11 MED ORDER — METHOCARBAMOL 500 MG PO TABS: 500.0000 mg | ORAL_TABLET | Freq: Three times a day (TID) | ORAL | Status: DC | PRN

## 2015-08-11 MED ORDER — FENTANYL CITRATE (PF) 100 MCG/2ML IJ SOLN: 100.0000 ug | INTRAMUSCULAR | Status: DC

## 2015-08-11 MED ORDER — LIDOCAINE HCL (PF) 1 % IJ SOLN: 10.0000 mL | Freq: Once | INTRAMUSCULAR | Status: DC

## 2015-08-11 MED ORDER — FENTANYL CITRATE (PF) 100 MCG/2ML IJ SOLN
INTRAMUSCULAR | Status: AC
  Administered 2015-08-11: 50 ug via INTRAVENOUS
  Filled 2015-08-11: qty 2

## 2015-08-11 MED ORDER — LACTATED RINGERS IV SOLN: 1000.0000 mL | INTRAVENOUS | Status: AC

## 2015-08-11 MED ORDER — MIDAZOLAM HCL 5 MG/5ML IJ SOLN: 5.0000 mg | INTRAMUSCULAR | Status: DC

## 2015-08-11 MED ORDER — ROPIVACAINE HCL 2 MG/ML IJ SOLN
INTRAMUSCULAR | Status: AC
  Administered 2015-08-11: 12:00:00
  Filled 2015-08-11: qty 20

## 2015-08-11 MED ORDER — MIDAZOLAM HCL 5 MG/5ML IJ SOLN
INTRAMUSCULAR | Status: AC
  Administered 2015-08-11: 2 mg via INTRAVENOUS
  Filled 2015-08-11: qty 5

## 2015-08-11 MED ORDER — TRIAMCINOLONE ACETONIDE 40 MG/ML IJ SUSP
INTRAMUSCULAR | Status: AC
  Administered 2015-08-11: 12:00:00
  Filled 2015-08-11: qty 2

## 2015-08-11 NOTE — Progress Notes (Signed)
Patient's Name: Keith Fuller MRN: [id number] DOB: 1950-12-17 DOS: 08/11/2015  Primary Reason(s) for Visit: Interventional Pain Management Treatment. CC: Back Pain   Pre-Procedure Assessment:  Keith Fuller is a 65 y.o. year old, male patient, seen today for interventional treatment. He has Benign prostatic hyperplasia with urinary obstruction; Degeneration of intervertebral disc of lumbar region; Elevated prostate specific antigen (PSA); Acute low back pain; Lumbar spondylosis; Chronic low back pain (Location of Primary Source of Pain) (Bilateral) (L>R); Lumbar facet syndrome (Bilateral) (L>R); Chronic pain; Musculoskeletal pain; Myofascial pain syndrome (lumbar region) (Bilateral) (L>R); Chronic lower extremity pain (Location of Secondary source of pain) (Referred Pain) (Bilateral) (L>R); Plantar fasciitis (Bilateral); Abnormal CT scan, lumbar spine; Abnormal x-ray of lumbar spine; Grade 1 Retrolisthesis of L2 over L3; Lumbar central spinal stenosis (L2-3, L3-4); 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 His primarily concern today is the Back Pain   Today's Initial Pain Score: 3/10 Reported level of pain is compatible with clinical observation Pain Type: Chronic pain Pain Location: Back Pain Orientation: Lower Pain Descriptors / Indicators: Aching, Dull Pain Frequency: Constant  Post-procedure Pain Score: 3   Date of Last Visit: 08/10/15 Service Provided on Last Visit: Evaluation  Verification of the correct person, correct site (including marking of site), and correct procedure were performed and confirmed by the patient.  Today's Vitals   08/11/15 1202 08/11/15 1212 08/11/15 1222 08/11/15 1227  BP: 127/81 129/65 124/66 130/70  Pulse: 61 65 58 60  Temp:      Resp: 12 14 12 16   Height:      Weight:      SpO2: 96% 100% 100% 99%  PainSc: Asleep 2   3   PainLoc:      Calculated  BMI: Body mass index is 28.49 kg/(m^2). Allergies: He is allergic to penicillins.. Primary Diagnosis: Facet syndrome, lumbar [M54.5]  Procedure:  Type: Diagnostic Medial Branch Facet Block Region: Lumbar Level: L2, L3, L4, L5, & S1 Medial Branch Level(s) Laterality: Bilateral  Indications: 1. Lumbar facet syndrome (Bilateral) (L>R)   2. Lumbar spondylosis, unspecified spinal osteoarthritis   3. Chronic low back pain (Location of Primary Source of Pain) (Bilateral) (L>R)   4. 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  Consent: Secured. Under the influence of no sedatives a written informed consent was obtained, after having provided information on the risks and possible complications. To fulfill our ethical and legal obligations, as recommended by the American Medical Association's Code of Ethics, we have provided information to the patient about our clinical impression; the nature and purpose of the treatment or procedure; the risks, benefits, and possible complications of the intervention; alternatives; the risk(s) and benefit(s) of the alternative treatment(s) or procedure(s); and the risk(s) and benefit(s) of doing nothing. The patient was provided information about the risks and possible complications associated with the  procedure. In the case of spinal procedures these may include, but are not limited to, failure to achieve desired goals, infection, bleeding, organ or nerve damage, allergic reactions, paralysis, and death. In addition, the patient was informed that Medicine is not an exact science; therefore, there is also the possibility of unforeseen risks and possible complications that may result in a catastrophic outcome. The patient indicated having understood very clearly. We have given the patient no guarantees and we have made no promises. Enough time was given to the patient to ask questions, all of which were answered to the patient's satisfaction.  Pre-Procedure Preparation: Safety Precautions: Allergies reviewed. Appropriate site, procedure, and patient were confirmed by following the Joint Commission's Universal Protocol (UP.01.01.01), in the form of a "Time Out". The patient was asked to confirm marked site and procedure, before commencing. The patient was asked about blood thinners, or active infections, both of which were denied. Patient was assessed for positional comfort and all pressure points were checked before starting procedure. Monitoring:  As per clinic protocol. Infection Control Precautions: Sterile technique used. Standard Universal Precautions were taken as recommended by the Department of [facility] for Disease Control and Prevention (CDC). Standard pre-surgical skin prep was conducted. Respiratory hygiene and cough etiquette was practiced. Hand hygiene observed. Safe injection practices and needle disposal techniques followed. SDV (single dose vial) medications used. Medications properly checked for expiration dates and contaminants. Personal protective equipment (PPE) used: Sterile double glove technique. Radiation resistant gloves. Sterile surgical gloves.  Anesthesia, Analgesia, Anxiolysis: Type: Moderate (Conscious) Sedation & Local Anesthesia Local Anesthetic: Lidocaine  1% Route: Intravenous (IV) IV Access: Secured Sedation: Meaningful verbal contact was maintained at all times during the procedure  Indication(s): Analgesia & Anxiolysis  Description of Procedure Process:  Time-out: "Time-out" completed before starting procedure, as per protocol. Position: Prone Target Area: For Lumbar Facet blocks, the target is the groove formed by the junction of the transverse process and superior articular process. For the L5 dorsal ramus, the target is the notch between superior articular process and sacral ala. For the S1 dorsal ramus, the target is the superior and lateral edge of the posterior S1 Sacral foramen. Approach: Paramedial approach. Area Prepped: Entire Posterior Lumbosacral Region Prepping solution: ChloraPrep (2% chlorhexidine gluconate and 70% isopropyl alcohol) Safety Precautions: Aspiration looking for blood return was conducted prior to all injections. At no point did we inject any substances, as a needle was being advanced. No attempts were made at seeking any paresthesias. Safe injection practices and needle disposal techniques used. Medications properly checked for expiration dates. SDV (single dose vial) medications used.   Description of the Procedure: Protocol guidelines were followed. The patient was placed in position over the fluoroscopy table. The target area was identified and the area prepped in the usual manner. Skin desensitized using vapocoolant spray. Skin & deeper tissues infiltrated with local anesthetic. Appropriate amount of time allowed to pass for local anesthetics to take effect. The procedure needle was introduced through the skin, ipsilateral to the reported pain, and advanced to the target area. Employing the "Medial Branch Technique", the needles were advanced to the angle made by the superior and medial portion of the transverse process, and the lateral and inferior portion of the superior articulating process of the targeted  vertebral bodies. This area is known as "Burton's Eye" or the "Eye of the [place] Dog". A procedure needle was introduced through the skin, and this time advanced to the angle made by the superior and medial  border of the sacral ala, and the lateral border of the S1 vertebral body. This last needle was later repositioned at the superior and lateral border of the posterior S1 foramen. Negative aspiration confirmed. Solution injected in intermittent fashion, asking for systemic symptoms every 0.5cc of injectate. The needles were then removed and the area cleansed, making sure to leave some of the prepping solution back to take advantage of its long term bactericidal properties. EBL: None Materials & Medications Used:  Needle(s) Used: 22g - 3.5" Spinal Needle(s) Medications Administered today: We administered ropivacaine (PF) 2 mg/ml (0.2%), fentaNYL, triamcinolone acetonide, and midazolam.Please see chart orders for dosing details.  Imaging Guidance:  Type of Imaging Technique: Fluoroscopy Guidance (Spinal) Indication(s): Assistance in needle guidance and placement for procedures requiring needle placement in or near specific anatomical locations not easily accessible without such assistance. Exposure Time: Please see nurses notes. Contrast: None required. Fluoroscopic Guidance: I was personally present in the fluoroscopy suite, where the patient was placed in position for the procedure, over the fluoroscopy-compatible table. Fluoroscopy was manipulated, using "Tunnel Vision Technique", to obtain the best possible view of the target area, on the affected side. Parallax error was corrected before commencing the procedure. A "direction-depth-direction" technique was used to introduce the needle under continuous pulsed fluoroscopic guidance. Once the target was reached, antero-posterior, oblique, and lateral fluoroscopic projection views were taken to confirm needle placement in all planes. Permanently  recorded images stored by scanning into EMR. Interpretation: Intraoperative imaging interpretation by performing Physician. Adequate needle placement confirmed. Adequate needle placement confirmed in AP, lateral, & Oblique Views. No contrast injected.  Antibiotics:  Type:  Antibiotics Given (last 72 hours)    None      Indication(s): No indications identified.  Post-operative Assessment:  Complications: No immediate post-treatment complications were observed. Disposition: Return to clinic for follow-up evaluation. The patient tolerated the entire procedure well. A repeat set of vitals were taken after the procedure and the patient was kept under observation following institutional policy, for this procedure. Post-procedural neurological assessment was performed, showing return to baseline, prior to discharge. The patient was discharged home, once institutional criteria were met. The patient was provided with post-procedure discharge instructions, including a section on how to identify potential problems. Should any problems arise concerning this procedure, the patient was given instructions to immediately contact us, at any time, without hesitation. In any case, we plan to contact the patient by telephone for a follow-up status report regarding this interventional procedure. Comments:  Today we have started the patient on Mobic 15 mg by mouth every the and Robaxin 500 mg 1 tablet by mouth 3 times a day with instructions not to take the Flexeril when he is taking the Robaxin.  Primary Care Physician: Adin Hector, MD Location: [facility] Note by: Kathlen Brunswick Dossie Arbour, M.D, DABA, DABAPM, DABPM, DABIPP, FIPP   Illustration of the posterior view of the lumbar spine and the posterior neural structures. Laminae of L2 through S1 are labeled. DPRL5, dorsal primary ramus of L5; DPRS1, dorsal primary ramus of S1; DPR3, dorsal primary ramus of L3; FJ, facet (zygapophyseal)  joint L3-L4; I, inferior articular process of L4; LB1, lateral branch of dorsal primary ramus of L1; IAB, inferior articular branches from L3 medial branch (supplies L4-L5 facet joint); IBP, intermediate branch plexus; MB3, medial branch of dorsal primary ramus of L3; NR3, third lumbar nerve root; S, superior articular process of L5; SAB, superior articular branches from L4 (supplies L4-5 facet  joint also); TP3, transverse process of L3.  Disclaimer:  Medicine is not an Chief Strategy Officer. The only guarantee in medicine is that nothing is guaranteed. It is important to note that the decision to proceed with this intervention was based on the information collected from the patient. The Data and conclusions were drawn from the patient's questionnaire, the interview, and the physical examination. Because the information was provided in large part by the patient, it cannot be guaranteed that it has not been purposely or unconsciously manipulated. Every effort has been made to obtain as much relevant data as possible for this evaluation. It is important to note that the conclusions that lead to this procedure are derived in large part from the available data. Always take into account that the treatment will also be dependent on availability of resources and existing treatment guidelines, considered by other Pain Management Practitioners as being common knowledge and practice, at the time of the intervention. For Medico-Legal purposes, it is also important to point out that variation in procedural techniques and pharmacological choices are the acceptable norm. The indications, contraindications, technique, and results of the above procedure should only be interpreted and judged by a Board-Certified Interventional Pain Specialist with extensive familiarity and expertise in the same exact procedure and technique. Attempts at providing opinions without similar or greater experience and expertise than that of the treating  physician will be considered as inappropriate and unethical, and shall result in a formal complaint to the state medical board and applicable specialty societies.

## 2015-08-11 NOTE — Progress Notes (Signed)
Safety precautions to be maintained throughout the outpatient stay will include: orient to surroundings, keep bed in low position, maintain call bell within reach at all times, provide assistance with transfer out of bed and ambulation.  

## 2015-08-11 NOTE — Patient Instructions (Signed)
Facet Joint Block The facet joints connect the bones of the spine (vertebrae). They make it possible for you to bend, twist, and make other movements with your spine. They also prevent you from overbending, overtwisting, and making other excessive movements.  A facet joint block is a procedure where a numbing medicine (anesthetic) is injected into a facet joint. Often, a type of anti-inflammatory medicine called a steroid is also injected. A facet joint block may be done for two reasons:   Diagnosis. A facet joint block may be done as a test to see whether neck or back pain is caused by a worn-down or infected facet joint. If the pain gets better after a facet joint block, it means the pain is probably coming from the facet joint. If the pain does not get better, it means the pain is probably not coming from the facet joint.   Therapy. A facet joint block may be done to relieve neck or back pain caused by a facet joint. A facet joint block is only done as a therapy if the pain does not improve with medicine, exercise programs, physical therapy, and other forms of pain management. LET YOUR HEALTH CARE PROVIDER KNOW ABOUT:   Any allergies you have.   All medicines you are taking, including vitamins, herbs, eyedrops, and over-the-counter medicines and creams.   Previous problems you or members of your family have had with the use of anesthetics.   Any blood disorders you have had.   Other health problems you have. RISKS AND COMPLICATIONS Generally, having a facet joint block is safe. However, as with any procedure, complications can occur. Possible complications associated with having a facet joint block include:   Bleeding.   Injury to a nerve near the injection site.   Pain at the injection site.   Weakness or numbness in areas controlled by nerves near the injection site.   Infection.   Temporary fluid retention.   Allergic reaction to anesthetics or medicines used during  the procedure. BEFORE THE PROCEDURE   Follow your health care provider's instructions if you are taking dietary supplements or medicines. You may need to stop taking them or reduce your dosage.   Do not take any new dietary supplements or medicines without asking your health care provider first.   Follow your health care provider's instructions about eating and drinking before the procedure. You may need to stop eating and drinking several hours before the procedure.   Arrange to have an adult drive you home after the procedure. PROCEDURE  You may need to remove your clothing and dress in an open-back gown so that your health care provider can access your spine.   The procedure will be done while you are lying on an X-ray table. Most of the time you will be asked to lie on your stomach, but you may be asked to lie in a different position if an injection will be made in your neck.   Special machines will be used to monitor your oxygen levels, heart rate, and blood pressure.   If an injection will be made in your neck, an intravenous (IV) tube will be inserted into one of your veins. Fluids and medicine will flow directly into your body through the IV tube.   The area over the facet joint where the injection will be made will be cleaned with an antiseptic soap. The surrounding skin will be covered with sterile drapes.   An anesthetic will be applied to your skin   to make the injection area numb. You may feel a temporary stinging or burning sensation.   A video X-ray machine will be used to locate the joint. A contrast dye may be injected into the facet joint area to help with locating the joint.   When the joint is located, an anesthetic medicine will be injected into the joint through the needle.   Your health care provider will ask you whether you feel pain relief. If you do feel relief, a steroid may be injected to provide pain relief for a longer period of time. If you do not  feel relief or feel only partial relief, additional injections of an anesthetic may be made in other facet joints.   The needle will be removed, the skin will be cleansed, and bandages will be applied.  AFTER THE PROCEDURE   You will be observed for 15-30 minutes before being allowed to go home. Do not drive. Have an adult drive you or take a taxi or public transportation instead.   If you feel pain relief, the pain will return in several hours or days when the anesthetic wears off.   You may feel pain relief 2-14 days after the procedure. The amount of time this relief lasts varies from person to person.   It is normal to feel some tenderness over the injected area(s) for 2 days following the procedure.   If you have diabetes, you may have a temporary increase in blood sugar.   This information is not intended to replace advice given to you by your health care provider. Make sure you discuss any questions you have with your health care provider.   Document Released: 11/07/2006 Document Revised: 07/09/2014 Document Reviewed: 04/07/2012 Elsevier Interactive Patient Education 2016 [employer] Pain Management Discharge Instructions  General Discharge Instructions :  If you need to reach your doctor call: Monday-Friday 8:00 am - 4:00 pm at [phone] or toll free [phone].  After clinic hours [phone] to have operator reach doctor.  Bring all of your medication bottles to all your appointments in the pain clinic.  To cancel or reschedule your appointment with Pain Management please remember to call 24 hours in advance to avoid a fee.  Refer to the educational materials which you have been given on: General Risks, I had my Procedure. Discharge Instructions, Post Sedation.  Post Procedure Instructions:  The drugs you were given will stay in your system until tomorrow, so for the next 24 hours you should not drive, make any legal decisions or drink any alcoholic  beverages.  You may eat anything you prefer, but it is better to start with liquids then soups and crackers, and gradually work up to solid foods.  Please notify your doctor immediately if you have any unusual bleeding, trouble breathing or pain that is not related to your normal pain.  Depending on the type of procedure that was done, some parts of your body may feel week and/or numb.  This usually clears up by tonight or the next day.  Walk with the use of an assistive device or accompanied by an adult for the 24 hours.  You may use ice on the affected area for the first 24 hours.  Put ice in a Ziploc bag and cover with a towel and place against area 15 minutes on 15 minutes off.  You may switch to heat after 24 hours. 

## 2015-08-12 ENCOUNTER — Telehealth: Payer: Self-pay | Admitting: *Deleted

## 2015-08-12 NOTE — Telephone Encounter (Signed)
Spoke with patient's wife and she states he is doing good with no complaints.

## 2015-08-15 ENCOUNTER — Ambulatory Visit
Admission: RE | Admit: 2015-08-15 | Discharge: 2015-08-15 | Disposition: A | Discharge status: Home / Self Care | Payer: BLUE CROSS/BLUE SHIELD | Source: Ambulatory Visit | Attending: Pain Medicine | Admitting: Pain Medicine

## 2015-08-15 ENCOUNTER — Encounter: Payer: Self-pay | Admitting: Pain Medicine

## 2015-08-15 DIAGNOSIS — M545 Low back pain, unspecified: Secondary | ICD-10-CM

## 2015-08-15 DIAGNOSIS — R531 Weakness: Secondary | ICD-10-CM | POA: Diagnosis not present

## 2015-08-15 DIAGNOSIS — M5136 Other intervertebral disc degeneration, lumbar region: Secondary | ICD-10-CM | POA: Insufficient documentation

## 2015-08-15 DIAGNOSIS — M4310 Spondylolisthesis, site unspecified: Secondary | ICD-10-CM | POA: Insufficient documentation

## 2015-08-15 NOTE — Progress Notes (Signed)
Quick Note:  This imaging study has been reviewed and not found to have any major pathology requiring urgent or emergency care. Imaging reveals: Degenerative disc and facet disease throughout the lumbar spine. Increasing anterolisthesis of L5 on S1 related to facet disease.  Possible treatment options: Since the patient's primary pain at this time is the lower back, Bilateral lumbar facet block under fluoroscopic guidance and IV sedation. ______

## 2015-09-01 ENCOUNTER — Ambulatory Visit: Payer: BLUE CROSS/BLUE SHIELD | Admitting: Pain Medicine

## 2015-09-15 ENCOUNTER — Encounter: Payer: Self-pay | Admitting: Pain Medicine

## 2015-09-15 ENCOUNTER — Ambulatory Visit: Payer: BLUE CROSS/BLUE SHIELD | Attending: Pain Medicine | Admitting: Pain Medicine

## 2015-09-15 VITALS — BP 156/60 | HR 68 | Temp 97.7°F | Resp 16 | Ht 69.0 in | Wt 200.0 lb

## 2015-09-15 DIAGNOSIS — B1920 Unspecified viral hepatitis C without hepatic coma: Secondary | ICD-10-CM | POA: Diagnosis not present

## 2015-09-15 DIAGNOSIS — M791 Myalgia: Secondary | ICD-10-CM | POA: Diagnosis not present

## 2015-09-15 DIAGNOSIS — M4806 Spinal stenosis, lumbar region: Secondary | ICD-10-CM | POA: Insufficient documentation

## 2015-09-15 DIAGNOSIS — M549 Dorsalgia, unspecified: Secondary | ICD-10-CM | POA: Diagnosis present

## 2015-09-15 DIAGNOSIS — M47816 Spondylosis without myelopathy or radiculopathy, lumbar region: Secondary | ICD-10-CM | POA: Insufficient documentation

## 2015-09-15 DIAGNOSIS — N138 Other obstructive and reflux uropathy: Secondary | ICD-10-CM | POA: Insufficient documentation

## 2015-09-15 DIAGNOSIS — G8929 Other chronic pain: Secondary | ICD-10-CM | POA: Diagnosis not present

## 2015-09-15 DIAGNOSIS — Q762 Congenital spondylolisthesis: Secondary | ICD-10-CM

## 2015-09-15 DIAGNOSIS — M722 Plantar fascial fibromatosis: Secondary | ICD-10-CM | POA: Insufficient documentation

## 2015-09-15 DIAGNOSIS — M79606 Pain in leg, unspecified: Secondary | ICD-10-CM | POA: Diagnosis not present

## 2015-09-15 DIAGNOSIS — M5136 Other intervertebral disc degeneration, lumbar region: Secondary | ICD-10-CM | POA: Insufficient documentation

## 2015-09-15 DIAGNOSIS — M545 Low back pain: Secondary | ICD-10-CM | POA: Insufficient documentation

## 2015-09-15 DIAGNOSIS — M4310 Spondylolisthesis, site unspecified: Secondary | ICD-10-CM

## 2015-09-15 DIAGNOSIS — M4316 Spondylolisthesis, lumbar region: Secondary | ICD-10-CM | POA: Insufficient documentation

## 2015-09-15 DIAGNOSIS — N401 Enlarged prostate with lower urinary tract symptoms: Secondary | ICD-10-CM | POA: Insufficient documentation

## 2015-09-15 DIAGNOSIS — Z87891 Personal history of nicotine dependence: Secondary | ICD-10-CM | POA: Diagnosis not present

## 2015-09-15 DIAGNOSIS — Z9889 Other specified postprocedural states: Secondary | ICD-10-CM | POA: Insufficient documentation

## 2015-09-15 MED ORDER — ORPHENADRINE CITRATE 30 MG/ML IJ SOLN
60.0000 mg | Freq: Once | INTRAMUSCULAR | Status: AC
  Administered 2015-09-15: 60 mg via INTRAMUSCULAR

## 2015-09-15 MED ORDER — KETOROLAC TROMETHAMINE 60 MG/2ML IM SOLN
60.0000 mg | Freq: Once | INTRAMUSCULAR | Status: AC
  Administered 2015-09-15: 60 mg via INTRAMUSCULAR

## 2015-09-15 NOTE — Progress Notes (Signed)
Patient's Name: Keith Fuller MRN: [id number] DOB: 1950-09-06 DOS: 09/15/2015  Primary Reason(s) for Visit: Encounter for Medication Management CC: Back Pain   HPI  Keith Fuller is a 65 y.o. year old, male patient, who returns today as an established patient. He has Benign prostatic hyperplasia with urinary obstruction; Degeneration of intervertebral disc of lumbar region; Elevated prostate specific antigen (PSA); Lumbar spondylosis; Chronic low back pain (Location of Primary Source of Pain) (Bilateral) (L>R); Lumbar facet syndrome (Bilateral) (L>R); Chronic pain; Musculoskeletal pain; Myofascial pain syndrome (lumbar region) (Bilateral) (L>R); Chronic lower extremity pain (Location of Secondary source of pain) (Referred Pain) (Bilateral) (L>R); Plantar fasciitis (Bilateral); Abnormal CT scan, lumbar spine; Abnormal x-ray of lumbar spine; Grade 1 Retrolisthesis of L2 over L3; Lumbar central spinal stenosis (L2-3, L3-4); 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 His primarily concern today is the Back Pain   The patient returns to the clinic today after having had a diagnostic bilateral lumbar facet block under fluoroscopic guidance and IV sedation. He indicates that this helped for about 2 weeks and today his pain is back. The results of his x-rays indicate that there is an increase in the L5-S1 anterolisthesis. Lately he has been having some difficulty voiding and he indicates that he had an appointment to see his physician, but somehow he missed it. His primary pain is in the lower back with the left being worse than the right. This pain appears to be primarily in the area of the tailbone. In view of this, today I have decided to go ahead and bring the patient back for a caudal epidural steroid injection under fluoroscopic guidance and IV  sedation.  Pain Assessment: Self-Reported Pain Score: 7  Reported level is compatible with observation Pain Type: Chronic pain Pain Location: Back Pain Orientation: Lower Pain Descriptors / Indicators: Sharp, Other (Comment), Constant, Aching (electifying) Pain Frequency: Constant  Date of Last Visit: 08/11/15 Service Provided on Last Visit: Procedure  Controlled Substance Pharmacotherapy Assessment  Currently not receiving any controlled substances from [place].   Laboratory Workup  Last ED UDS: No results found for: THCU, COCAINSCRNUR, PCPSCRNUR, MDMA, AMPHETMU, METHADONE, ETOH  Inflammation Markers No results found for: ESRSEDRATE, CRP  Renal Function Lab Results  Component Value Date   BUN 17 02/26/2014   CREATININE 1.24 02/26/2014   GFRAA >60 02/26/2014   GFRNONAA >60 02/26/2014    Hepatic Function No results found for: AST, ALT, ALBUMIN  Electrolytes Lab Results  Component Value Date   NA 135* 02/26/2014   K 4.1 02/26/2014   CL 102 02/26/2014   CALCIUM 7.9* 02/26/2014    Post-Procedure Assessment  Procedure done on last visit: Diagnostic bilateral lumbar facet block under fluoroscopic guidance and IV sedation. Side-effects or Adverse reactions: None reported Sedation: Sedation given  Results: Ultra-Short Term Relief (First 1 hour after procedure): 70 %  Analgesia during this period is likely to be Local Anesthetic and/or IV Sedative (Analgesic/Anxiolitic) related Short Term Relief (Initial 4-6 hrs after procedure): 60 % Complete relief confirms area to be the source of pain Long Term Relief : 20 % (3 weeks out patient has about 20% relief.  patient states that relief lasted approx 1 week with increased activity level) Long-term benefit would suggest an inflammatory etiology to the pain   Current Relief (Now):  20 %  Persistent relief would suggest effective anti-inflammatory  effects from steroids Interpretation of Results: The results of this diagnostic  injection would indicate that about 70% of the pain in the lower back is secondary to the lumbar facets with an additional 30% that he is on accounted for. Since the remainder of the pain that he has this very stent is in the tailbone area, we will bring the patient back for a caudal epidural steroid injections to see if this can help him.  Allergies  Mr. Godshall is allergic to penicillins.  Meds  The patient has a current medication list which includes the following prescription(s): cyclobenzaprine, finasteride, methocarbamol, fish oil, omeprazole, and meloxicam.  Current Outpatient Prescriptions on File Prior to Visit  Medication Sig  . cyclobenzaprine (FLEXERIL) 10 MG tablet Take 1 tablet (10 mg total) by mouth 3 (three) times daily as needed for muscle spasms.  Marland Kitchen. FINASTERIDE PO Take 5 mg by mouth every morning.  . methocarbamol (ROBAXIN) 500 MG tablet Take 1 tablet (500 mg total) by mouth every 8 (eight) hours as needed for muscle spasms.  . Omega-3 Fatty Acids (FISH OIL) 1000 MG CPDR Take 1,000 mg by mouth every morning.  Marland Kitchen. omeprazole (PRILOSEC) 20 MG capsule Take 20 mg by mouth every morning.  . meloxicam (MOBIC) 15 MG tablet Take 1 tablet (15 mg total) by mouth daily. In AM. (Patient not taking: Reported on 09/15/2015)   No current facility-administered medications on file prior to visit.    ROS  Constitutional: Afebrile, no chills, well hydrated and well nourished Gastrointestinal: negative Musculoskeletal:negative Neurological: negative Behavioral/Psych: negative  PFSH  Medical:  Mr. Keith Fuller  has a past medical history of Allergy and Hepatitis C. Family: family history includes Arthritis in his mother; Cancer in his father; Stroke in his father. Surgical:  has past surgical history that includes Joint replacement; Hernia repair; and Prostate surgery. Tobacco:  reports that he has quit smoking. He does not have any smokeless tobacco history on file. Alcohol:  reports that he  drinks alcohol. Drug:  reports that he does not use illicit drugs.  Physical Exam  Vitals:  Today's Vitals   09/15/15 1403  BP: 156/60  Pulse: 68  Temp: 97.7 F (36.5 C)  TempSrc: Oral  Resp: 16  Height: 5\' 9"  (1.753 m)  Weight: 200 lb (90.719 kg)  SpO2: 100%  PainSc: 7     Calculated BMI: Body mass index is 29.52 kg/(m^2).     General appearance: alert, cooperative, appears stated age and mild distress Eyes: PERLA Respiratory: No evidence respiratory distress, no audible rales or ronchi and no use of accessory muscles of respiration  Cervical Spine Inspection: Normal anatomy Alignment: Symetrical ROM: Adequate  Upper Extremities Inspection: No gross anomalies detected ROM: Adequate Sensory: Normal Motor: Unremarkable  Thoracic Spine Inspection: No gross anomalies detected Alignment: Symetrical ROM: Adequate  Lumbar Spine Inspection: No gross anomalies detected Alignment: Symetrical ROM: Decreased  Gait: WNL  Lower Extremities Inspection: No gross anomalies detected ROM: Adequate Sensory:  Normal Motor: Unremarkable  Assessment & Plan  Primary Diagnosis & Pertinent Problem List: The primary encounter diagnosis was Chronic pain. Diagnoses of Chronic low back pain (Location of Primary Source of Pain) (Bilateral) (L>R), Chronic pain of lower extremity, unspecified laterality, and Grade 1 Anterolisthesis (5 mm) of L5 over S1 were also pertinent to this visit.  Visit Diagnosis: 1. Chronic pain   2. Chronic low back pain (Location of Primary Source of Pain) (Bilateral) (L>R)   3. Chronic pain of lower extremity, unspecified laterality  4. Grade 1 Anterolisthesis (5 mm) of L5 over S1     Problem-specific Plan(s): No problem-specific assessment & plan notes found for this encounter.   Plan of Care  Pharmacotherapy (Medications Ordered): Meds ordered this encounter  Medications  . orphenadrine (NORFLEX) injection 60 mg    Sig:   . ketorolac (TORADOL)  injection 60 mg    Sig:     Lab-work & Procedure Ordered: Orders Placed This Encounter  Procedures  . LUMBAR EPIDURAL STEROID INJECTION    Standing Status: Future     Number of Occurrences:      Standing Expiration Date: 09/14/2016    Scheduling Instructions:     Side: Left     Sedation: None     Timeframe: ASAP    Order Specific Question:  Where will this procedure be performed?    Answer:  [facility]    Imaging Ordered: None  Interventional Therapies: Scheduled: Left caudal epidural steroid injection under fluoroscopic guidance, no sedation. PRN Procedures: Possible transforaminal L5-S1 epidural steroid injection under fluoroscopic guidance.    Referral(s) or Consult(s): None at this time.  Medications administered during this visit: We administered orphenadrine and ketorolac.  Future Appointments Date Time Provider Department Center  09/20/2015 10:20 AM Delano Metz, MD [facility] None    Primary Care Physician: Lynnea Ferrier, MD Location: [facility] Note by: Sydnee Levans. Laban Emperor, M.D, DABA, DABAPM, DABPM, DABIPP, FIPP  Pain Score Disclaimer: We use the NRS-11 scale. This is a self-reported, subjective measurement of pain severity with only modest accuracy. It is used primarily to identify changes within a particular patient. It must be understood that outpatient pain scales are significantly less accurate that those used for research, where they can be applied under ideal controlled circumstances with minimal exposure to variables. In reality, the score is likely to be a combination of pain intensity and pain affect, where pain affect describes the degree of emotional arousal or changes in action readiness caused by the sensory experience of pain. Factors such as social and work situation, setting, emotional state, anxiety levels, expectation, and prior pain experience may influence pain perception and show large  inter-individual differences that may also be affected by time variables.

## 2015-09-15 NOTE — Patient Instructions (Signed)
GENERAL RISKS AND COMPLICATIONS  What are the risk, side effects and possible complications? Generally speaking, most procedures are safe.  However, with any procedure there are risks, side effects, and the possibility of complications.  The risks and complications are dependent upon the sites that are lesioned, or the type of nerve block to be performed.  The closer the procedure is to the spine, the more serious the risks are.  Great care is taken when placing the radio frequency needles, block needles or lesioning probes, but sometimes complications can occur. 1. Infection: Any time there is an injection through the skin, there is a risk of infection.  This is why sterile conditions are used for these blocks.  There are four possible types of infection. 1. Localized skin infection. 2. Central Nervous System Infection-This can be in the form of Meningitis, which can be deadly. 3. Epidural Infections-This can be in the form of an epidural abscess, which can cause pressure inside of the spine, causing compression of the spinal cord with subsequent paralysis. This would require an emergency surgery to decompress, and there are no guarantees that the patient would recover from the paralysis. 4. Discitis-This is an infection of the intervertebral discs.  It occurs in about 1% of discography procedures.  It is difficult to treat and it may lead to surgery.        2. Pain: the needles have to go through skin and soft tissues, will cause soreness.       3. Damage to internal structures:  The nerves to be lesioned may be near blood vessels or    other nerves which can be potentially damaged.       4. Bleeding: Bleeding is more common if the patient is taking blood thinners such as  aspirin, Coumadin, Ticiid, Plavix, etc., or if he/she have some genetic predisposition  such as hemophilia. Bleeding into the spinal canal can cause compression of the spinal  cord with subsequent paralysis.  This would require an  emergency surgery to  decompress and there are no guarantees that the patient would recover from the  paralysis.       5. Pneumothorax:  Puncturing of a lung is a possibility, every time a needle is introduced in  the area of the chest or upper back.  Pneumothorax refers to free air around the  collapsed lung(s), inside of the thoracic cavity (chest cavity).  Another two possible  complications related to a similar event would include: Hemothorax and Chylothorax.   These are variations of the Pneumothorax, where instead of air around the collapsed  lung(s), you may have blood or chyle, respectively.       6. Spinal headaches: They may occur with any procedures in the area of the spine.       7. Persistent CSF (Cerebro-Spinal Fluid) leakage: This is a rare problem, but may occur  with prolonged intrathecal or epidural catheters either due to the formation of a fistulous  track or a dural tear.       8. Nerve damage: By working so close to the spinal cord, there is always a possibility of  nerve damage, which could be as serious as a permanent spinal cord injury with  paralysis.       9. Death:  Although rare, severe deadly allergic reactions known as "Anaphylactic  reaction" can occur to any of the medications used.      10. Worsening of the symptoms:  We can always make thing worse.    What are the chances of something like this happening? Chances of any of this occuring are extremely low.  By statistics, you have more of a chance of getting killed in a motor vehicle accident: while driving to the hospital than any of the above occurring .  Nevertheless, you should be aware that they are possibilities.  In general, it is similar to taking a shower.  Everybody knows that you can slip, hit your head and get killed.  Does that mean that you should not shower again?  Nevertheless always keep in mind that statistics do not mean anything if you happen to be on the wrong side of them.  Even if a procedure has a 1  (one) in a 1,000,000 (million) chance of going wrong, it you happen to be that one..Also, keep in mind that by statistics, you have more of a chance of having something go wrong when taking medications.  Who should not have this procedure? If you are on a blood thinning medication (e.g. Coumadin, Plavix, see list of "Blood Thinners"), or if you have an active infection going on, you should not have the procedure.  If you are taking any blood thinners, please inform your physician.  How should I prepare for this procedure?  Do not eat or drink anything at least six hours prior to the procedure.  Bring a driver with you .  It cannot be a taxi.  Come accompanied by an adult that can drive you back, and that is strong enough to help you if your legs get weak or numb from the local anesthetic.  Take all of your medicines the morning of the procedure with just enough water to swallow them.  If you have diabetes, make sure that you are scheduled to have your procedure done first thing in the morning, whenever possible.  If you have diabetes, take only half of your insulin dose and notify our nurse that you have done so as soon as you arrive at the clinic.  If you are diabetic, but only take blood sugar pills (oral hypoglycemic), then do not take them on the morning of your procedure.  You may take them after you have had the procedure.  Do not take aspirin or any aspirin-containing medications, at least eleven (11) days prior to the procedure.  They may prolong bleeding.  Wear loose fitting clothing that may be easy to take off and that you would not mind if it got stained with Betadine or blood.  Do not wear any jewelry or perfume  Remove any nail coloring.  It will interfere with some of our monitoring equipment.  NOTE: Remember that this is not meant to be interpreted as a complete list of all possible complications.  Unforeseen problems may occur.  BLOOD THINNERS The following drugs  contain aspirin or other products, which can cause increased bleeding during surgery and should not be taken for 2 weeks prior to and 1 week after surgery.  If you should need take something for relief of minor pain, you may take acetaminophen which is found in Tylenol,m Datril, Anacin-3 and Panadol. It is not blood thinner. The products listed below are.  Do not take any of the products listed below in addition to any listed on your instruction sheet.  A.P.C or A.P.C with Codeine Codeine Phosphate Capsules #3 Ibuprofen Ridaura  ABC compound Congesprin Imuran rimadil  Advil Cope Indocin Robaxisal  Alka-Seltzer Effervescent Pain Reliever and Antacid Coricidin or Coricidin-D  Indomethacin Rufen    Alka-Seltzer plus Cold Medicine Cosprin Ketoprofen S-A-C Tablets  Anacin Analgesic Tablets or Capsules Coumadin Korlgesic Salflex  Anacin Extra Strength Analgesic tablets or capsules CP-2 Tablets Lanoril Salicylate  Anaprox Cuprimine Capsules Levenox Salocol  Anexsia-D Dalteparin Magan Salsalate  Anodynos Darvon compound Magnesium Salicylate Sine-off  Ansaid Dasin Capsules Magsal Sodium Salicylate  Anturane Depen Capsules Marnal Soma  APF Arthritis pain formula Dewitt's Pills Measurin Stanback  Argesic Dia-Gesic Meclofenamic Sulfinpyrazone  Arthritis Bayer Timed Release Aspirin Diclofenac Meclomen Sulindac  Arthritis pain formula Anacin Dicumarol Medipren Supac  Analgesic (Safety coated) Arthralgen Diffunasal Mefanamic Suprofen  Arthritis Strength Bufferin Dihydrocodeine Mepro Compound Suprol  Arthropan liquid Dopirydamole Methcarbomol with Aspirin Synalgos  ASA tablets/Enseals Disalcid Micrainin Tagament  Ascriptin Doan's Midol Talwin  Ascriptin A/D Dolene Mobidin Tanderil  Ascriptin Extra Strength Dolobid Moblgesic Ticlid  Ascriptin with Codeine Doloprin or Doloprin with Codeine Momentum Tolectin  Asperbuf Duoprin Mono-gesic Trendar  Aspergum Duradyne Motrin or Motrin IB Triminicin  Aspirin  plain, buffered or enteric coated Durasal Myochrisine Trigesic  Aspirin Suppositories Easprin Nalfon Trillsate  Aspirin with Codeine Ecotrin Regular or Extra Strength Naprosyn Uracel  Atromid-S Efficin Naproxen Ursinus  Auranofin Capsules Elmiron Neocylate Vanquish  Axotal Emagrin Norgesic Verin  Azathioprine Empirin or Empirin with Codeine Normiflo Vitamin E  Azolid Emprazil Nuprin Voltaren  Bayer Aspirin plain, buffered or children's or timed BC Tablets or powders Encaprin Orgaran Warfarin Sodium  Buff-a-Comp Enoxaparin Orudis Zorpin  Buff-a-Comp with Codeine Equegesic Os-Cal-Gesic   Buffaprin Excedrin plain, buffered or Extra Strength Oxalid   Bufferin Arthritis Strength Feldene Oxphenbutazone   Bufferin plain or Extra Strength Feldene Capsules Oxycodone with Aspirin   Bufferin with Codeine Fenoprofen Fenoprofen Pabalate or Pabalate-SF   Buffets II Flogesic Panagesic   Buffinol plain or Extra Strength Florinal or Florinal with Codeine Panwarfarin   Buf-Tabs Flurbiprofen Penicillamine   Butalbital Compound Four-way cold tablets Penicillin   Butazolidin Fragmin Pepto-Bismol   Carbenicillin Geminisyn Percodan   Carna Arthritis Reliever Geopen Persantine   Carprofen Gold's salt Persistin   Chloramphenicol Goody's Phenylbutazone   Chloromycetin Haltrain Piroxlcam   Clmetidine heparin Plaquenil   Cllnoril Hyco-pap Ponstel   Clofibrate Hydroxy chloroquine Propoxyphen         Before stopping any of these medications, be sure to consult the physician who ordered them.  Some, such as Coumadin (Warfarin) are ordered to prevent or treat serious conditions such as "deep thrombosis", "pumonary embolisms", and other heart problems.  The amount of time that you may need off of the medication may also vary with the medication and the reason for which you were taking it.  If you are taking any of these medications, please make sure you notify your pain physician before you undergo any  procedures.         Epidural Steroid Injection Patient Information  Description: The epidural space surrounds the nerves as they exit the spinal cord.  In some patients, the nerves can be compressed and inflamed by a bulging disc or a tight spinal canal (spinal stenosis).  By injecting steroids into the epidural space, we can bring irritated nerves into direct contact with a potentially helpful medication.  These steroids act directly on the irritated nerves and can reduce swelling and inflammation which often leads to decreased pain.  Epidural steroids may be injected anywhere along the spine and from the neck to the low back depending upon the location of your pain.   After numbing the skin with local anesthetic (like Novocaine), a small needle is passed   into the epidural space slowly.  You may experience a sensation of pressure while this is being done.  The entire block usually last less than 10 minutes.  Conditions which may be treated by epidural steroids:   Low back and leg pain  Neck and arm pain  Spinal stenosis  Post-laminectomy syndrome  Herpes zoster (shingles) pain  Pain from compression fractures  Preparation for the injection:  1. Do not eat any solid food or dairy products within 8 hours of your appointment.  2. You may drink clear liquids up to 3 hours before appointment.  Clear liquids include water, black coffee, juice or soda.  No milk or cream please. 3. You may take your regular medication, including pain medications, with a sip of water before your appointment  Diabetics should hold regular insulin (if taken separately) and take 1/2 normal NPH dos the morning of the procedure.  Carry some sugar containing items with you to your appointment. 4. A driver must accompany you and be prepared to drive you home after your procedure.  5. Bring all your current medications with your. 6. An IV may be inserted and sedation may be given at the discretion of the  physician.   7. A blood pressure cuff, EKG and other monitors will often be applied during the procedure.  Some patients may need to have extra oxygen administered for a short period. 8. You will be asked to provide medical information, including your allergies, prior to the procedure.  We must know immediately if you are taking blood thinners (like Coumadin/Warfarin)  Or if you are allergic to IV iodine contrast (dye). We must know if you could possible be pregnant.  Possible side-effects:  Bleeding from needle site  Infection (rare, may require surgery)  Nerve injury (rare)  Numbness & tingling (temporary)  Difficulty urinating (rare, temporary)  Spinal headache ( a headache worse with upright posture)  Light -headedness (temporary)  Pain at injection site (several days)  Decreased blood pressure (temporary)  Weakness in arm/leg (temporary)  Pressure sensation in back/neck (temporary)  Call if you experience:  Fever/chills associated with headache or increased back/neck pain.  Headache worsened by an upright position.  New onset weakness or numbness of an extremity below the injection site  Hives or difficulty breathing (go to the emergency room)  Inflammation or drainage at the infection site  Severe back/neck pain  Any new symptoms which are concerning to you  Please note:  Although the local anesthetic injected can often make your back or neck feel good for several hours after the injection, the pain will likely return.  It takes 3-7 days for steroids to work in the epidural space.  You may not notice any pain relief for at least that one week.  If effective, we will often do a series of three injections spaced 3-6 weeks apart to maximally decrease your pain.  After the initial series, we generally will wait several months before considering a repeat injection of the same type.  If you have any questions, please call [phone] [facility] 

## 2015-09-15 NOTE — Progress Notes (Signed)
Post procedure evaluation and review of x-ray results.

## 2015-09-20 ENCOUNTER — Encounter: Payer: Self-pay | Admitting: Pain Medicine

## 2015-09-20 ENCOUNTER — Ambulatory Visit: Payer: BLUE CROSS/BLUE SHIELD | Attending: Pain Medicine | Admitting: Pain Medicine

## 2015-09-20 VITALS — BP 143/93 | HR 67 | Temp 97.7°F | Resp 17 | Ht 69.0 in | Wt 200.0 lb

## 2015-09-20 DIAGNOSIS — M4806 Spinal stenosis, lumbar region: Secondary | ICD-10-CM | POA: Insufficient documentation

## 2015-09-20 DIAGNOSIS — N138 Other obstructive and reflux uropathy: Secondary | ICD-10-CM | POA: Diagnosis not present

## 2015-09-20 DIAGNOSIS — M545 Low back pain: Secondary | ICD-10-CM | POA: Insufficient documentation

## 2015-09-20 DIAGNOSIS — M47896 Other spondylosis, lumbar region: Secondary | ICD-10-CM | POA: Insufficient documentation

## 2015-09-20 DIAGNOSIS — M5416 Radiculopathy, lumbar region: Secondary | ICD-10-CM | POA: Diagnosis not present

## 2015-09-20 DIAGNOSIS — M722 Plantar fascial fibromatosis: Secondary | ICD-10-CM | POA: Diagnosis not present

## 2015-09-20 DIAGNOSIS — M79605 Pain in left leg: Secondary | ICD-10-CM | POA: Insufficient documentation

## 2015-09-20 DIAGNOSIS — M4316 Spondylolisthesis, lumbar region: Secondary | ICD-10-CM | POA: Diagnosis not present

## 2015-09-20 DIAGNOSIS — R972 Elevated prostate specific antigen [PSA]: Secondary | ICD-10-CM | POA: Insufficient documentation

## 2015-09-20 DIAGNOSIS — M791 Myalgia: Secondary | ICD-10-CM | POA: Insufficient documentation

## 2015-09-20 DIAGNOSIS — M79602 Pain in left arm: Secondary | ICD-10-CM | POA: Diagnosis not present

## 2015-09-20 DIAGNOSIS — G8929 Other chronic pain: Secondary | ICD-10-CM | POA: Insufficient documentation

## 2015-09-20 DIAGNOSIS — N401 Enlarged prostate with lower urinary tract symptoms: Secondary | ICD-10-CM | POA: Insufficient documentation

## 2015-09-20 DIAGNOSIS — M5116 Intervertebral disc disorders with radiculopathy, lumbar region: Secondary | ICD-10-CM | POA: Diagnosis not present

## 2015-09-20 DIAGNOSIS — M549 Dorsalgia, unspecified: Secondary | ICD-10-CM | POA: Diagnosis present

## 2015-09-20 MED ORDER — LACTATED RINGERS IV SOLN: 1000.0000 mL | INTRAVENOUS | Status: DC

## 2015-09-20 MED ORDER — ROPIVACAINE HCL 2 MG/ML IJ SOLN: 2.0000 mL | Freq: Once | INTRAMUSCULAR | Status: DC

## 2015-09-20 MED ORDER — IOHEXOL 180 MG/ML  SOLN: 10.0000 mL | Freq: Once | INTRAMUSCULAR | Status: DC | PRN

## 2015-09-20 MED ORDER — FENTANYL CITRATE (PF) 100 MCG/2ML IJ SOLN: 100.0000 ug | INTRAMUSCULAR | Status: DC

## 2015-09-20 MED ORDER — LIDOCAINE HCL (PF) 1 % IJ SOLN
INTRAMUSCULAR | Status: AC
  Administered 2015-09-20: 12:00:00
  Filled 2015-09-20: qty 5

## 2015-09-20 MED ORDER — LIDOCAINE HCL (PF) 1 % IJ SOLN: 10.0000 mL | Freq: Once | INTRAMUSCULAR | Status: DC

## 2015-09-20 MED ORDER — SODIUM CHLORIDE 0.9% FLUSH: 2.0000 mL | Freq: Once | INTRAVENOUS | Status: DC

## 2015-09-20 MED ORDER — ROPIVACAINE HCL 2 MG/ML IJ SOLN
INTRAMUSCULAR | Status: AC
  Administered 2015-09-20: 12:00:00
  Filled 2015-09-20: qty 10

## 2015-09-20 MED ORDER — MIDAZOLAM HCL 5 MG/5ML IJ SOLN: 5.0000 mg | INTRAMUSCULAR | Status: DC

## 2015-09-20 MED ORDER — SODIUM CHLORIDE 0.9 % IJ SOLN
INTRAMUSCULAR | Status: AC
  Administered 2015-09-20: 12:00:00
  Filled 2015-09-20: qty 10

## 2015-09-20 MED ORDER — TRIAMCINOLONE ACETONIDE 40 MG/ML IJ SUSP
INTRAMUSCULAR | Status: AC
  Administered 2015-09-20: 12:00:00
  Filled 2015-09-20: qty 1

## 2015-09-20 MED ORDER — TRIAMCINOLONE ACETONIDE 40 MG/ML IJ SUSP: 40.0000 mg | Freq: Once | INTRAMUSCULAR | Status: DC

## 2015-09-20 NOTE — Patient Instructions (Signed)
Epidural Steroid Injection Patient Information  Description: The epidural space surrounds the nerves as they exit the spinal cord.  In some patients, the nerves can be compressed and inflamed by a bulging disc or a tight spinal canal (spinal stenosis).  By injecting steroids into the epidural space, we can bring irritated nerves into direct contact with a potentially helpful medication.  These steroids act directly on the irritated nerves and can reduce swelling and inflammation which often leads to decreased pain.  Epidural steroids may be injected anywhere along the spine and from the neck to the low back depending upon the location of your pain.   After numbing the skin with local anesthetic (like Novocaine), a small needle is passed into the epidural space slowly.  You may experience a sensation of pressure while this is being done.  The entire block usually last less than 10 minutes.  Conditions which may be treated by epidural steroids:   Low back and leg pain  Neck and arm pain  Spinal stenosis  Post-laminectomy syndrome  Herpes zoster (shingles) pain  Pain from compression fractures  Preparation for the injection:  1. Do not eat any solid food or dairy products within 8 hours of your appointment.  2. You may drink clear liquids up to 3 hours before appointment.  Clear liquids include water, black coffee, juice or soda.  No milk or cream please. 3. You may take your regular medication, including pain medications, with a sip of water before your appointment  Diabetics should hold regular insulin (if taken separately) and take 1/2 normal NPH dos the morning of the procedure.  Carry some sugar containing items with you to your appointment. 4. A driver must accompany you and be prepared to drive you home after your procedure.  5. Bring all your current medications with your. 6. An IV may be inserted and sedation may be given at the discretion of the physician.   7. A blood pressure  cuff, EKG and other monitors will often be applied during the procedure.  Some patients may need to have extra oxygen administered for a short period. 8. You will be asked to provide medical information, including your allergies, prior to the procedure.  We must know immediately if you are taking blood thinners (like Coumadin/Warfarin)  Or if you are allergic to IV iodine contrast (dye). We must know if you could possible be pregnant.  Possible side-effects:  Bleeding from needle site  Infection (rare, may require surgery)  Nerve injury (rare)  Numbness & tingling (temporary)  Difficulty urinating (rare, temporary)  Spinal headache ( a headache worse with upright posture)  Light -headedness (temporary)  Pain at injection site (several days)  Decreased blood pressure (temporary)  Weakness in arm/leg (temporary)  Pressure sensation in back/neck (temporary)  Call if you experience:  Fever/chills associated with headache or increased back/neck pain.  Headache worsened by an upright position.  New onset weakness or numbness of an extremity below the injection site  Hives or difficulty breathing (go to the emergency room)  Inflammation or drainage at the infection site  Severe back/neck pain  Any new symptoms which are concerning to you  Please note:  Although the local anesthetic injected can often make your back or neck feel good for several hours after the injection, the pain will likely return.  It takes 3-7 days for steroids to work in the epidural space.  You may not notice any pain relief for at least that one week.    If effective, we will often do a series of three injections spaced 3-6 weeks apart to maximally decrease your pain.  After the initial series, we generally will wait several months before considering a repeat injection of the same type.  If you have any questions, please call [phone] [facility]  Discharge Instructions  General Discharge Instructions :  If you need to reach your doctor call: Monday-Friday 8:00 am - 4:00 pm at [phone] or toll free [phone].  After clinic hours [phone] to have operator reach doctor.  Bring all of your medication bottles to all your appointments in the pain clinic.  To cancel or reschedule your appointment with Pain Management please remember to call 24 hours in advance to avoid a fee.  Refer to the educational materials which you have been given on: General Risks, I had my Procedure. Discharge Instructions, Post Sedation.  Post Procedure Instructions:  The drugs you were given will stay in your system until tomorrow, so for the next 24 hours you should not drive, make any legal decisions or drink any alcoholic beverages.  You may eat anything you prefer, but it is better to start with liquids then soups and crackers, and gradually work up to solid foods.  Please notify your doctor immediately if you have any unusual bleeding, trouble breathing or pain that is not related to your normal pain.  Depending on the type of procedure that was done, some parts of your body may feel week and/or numb.  This usually clears up by tonight or the next day.  Walk with the use of an assistive device or accompanied by an adult for the 24 hours.  You may use ice on the affected area for the first 24 hours.  Put ice in a Ziploc bag and cover with a towel and place against area 15 minutes on 15 minutes off.  You may switch to heat after 24 hours. 

## 2015-09-20 NOTE — Progress Notes (Signed)
Patient's Name: Keith Fuller MRN: [id number] DOB: 18-May-1951 DOS: 09/20/2015  Primary Reason(s) for Visit: Interventional Pain Management Treatment. CC: Back Pain   Pre-Procedure Assessment:  Keith Fuller is a 65 y.o. year old, male patient, seen today for interventional treatment. He has Benign prostatic hyperplasia with urinary obstruction; Degeneration of intervertebral disc of lumbar region; Elevated prostate specific antigen (PSA); Lumbar spondylosis; Chronic low back pain (Location of Primary Source of Pain) (Bilateral) (L>R); Lumbar facet syndrome (Bilateral) (L>R); Chronic pain; Musculoskeletal pain; Myofascial pain syndrome (lumbar region) (Bilateral) (L>R); Chronic lower extremity pain (Location of Secondary source of pain) (Referred Pain) (Bilateral) (L>R); Plantar fasciitis (Bilateral); Abnormal CT scan, lumbar spine; Abnormal x-ray of lumbar spine; Grade 1 Retrolisthesis of L2 over L3; Lumbar central spinal stenosis (L2-3, L3-4); 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 His primarily concern today is the Back Pain   Today's Initial Pain Score: 7/10 Reported level of pain is incompatible with clinical obrservations. This may be secondary to a possible lack of understanding on how the pain scale works. Pain Type: Chronic pain Pain Descriptors / Indicators: Burning, Spasm Pain Frequency: Constant  Post-procedure Pain Score: 5   Date of Last Visit: 09/15/15 Service Provided on Last Visit: Med Refill  Verification of the correct person, correct site (including marking of site), and correct procedure were performed and confirmed by the patient.  Today's Vitals   09/20/15 1142 09/20/15 1147 09/20/15 1152 09/20/15 1155  BP: 134/100 152/104  143/93   Pulse: 66 67 72 67  Temp:      TempSrc:      Resp: 16 17 19 17   Height:      Weight:      SpO2: 99% 97% 98% 99%  PainSc:    5   Calculated BMI: Body mass index is 29.52 kg/(m^2). Allergies: He is allergic to penicillins.. Primary Diagnosis: Chronic radicular lumbar pain [M54.16, G89.29]  Procedure:  Type: Diagnostic Epidural Steroid Injection Region: Caudal Level: Sacrococcygeal   Laterality: Midline    Indications: 1. Chronic lumbar radicular pain (Location of Secondary source of pain) (Bilateral) (L>R)   2. Chronic pain of lower extremity, left   3. Chronic low back pain (Location of Primary Source of Pain) (Bilateral) (L>R)     In addition, Keith Fuller has Degeneration of intervertebral disc of lumbar region; Lumbar spondylosis; Chronic low back pain (Location of Primary Source of Pain) (Bilateral) (L>R); Lumbar facet syndrome (Bilateral) (L>R); Chronic pain; Musculoskeletal pain; Myofascial pain syndrome (lumbar region) (Bilateral) (L>R); Chronic lower extremity pain (Location of Secondary source of pain) (Referred Pain) (Bilateral) (L>R); Plantar fasciitis (Bilateral); Grade 1 Retrolisthesis of L2 over L3; Lumbar central spinal stenosis (L2-3, L3-4); 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  Consent: Secured. Under the influence of no sedatives a written informed consent was obtained, after having provided information on the risks and possible complications. To fulfill our ethical and legal obligations, as recommended by the American Medical Association's Code of Ethics, we have provided information to the patient about our clinical impression; the nature and purpose of the treatment or  procedure; the risks, benefits, and possible  complications of the intervention; alternatives; the risk(s) and benefit(s) of the alternative treatment(s) or procedure(s); and the risk(s) and benefit(s) of doing nothing. The patient was provided information about the risks and possible complications associated with the procedure. In the case of spinal procedures these may include, but are not limited to, failure to achieve desired goals, infection, bleeding, organ or nerve damage, allergic reactions, paralysis, and death. In addition, the patient was informed that Medicine is not an exact science; therefore, there is also the possibility of unforeseen risks and possible complications that may result in a catastrophic outcome. The patient indicated having understood very clearly. We have given the patient no guarantees and we have made no promises. Enough time was given to the patient to ask questions, all of which were answered to the patient's satisfaction.  Pre-Procedure Preparation: Safety Precautions: Allergies reviewed. Appropriate site, procedure, and patient were confirmed by following the Joint Commission's Universal Protocol (UP.01.01.01), in the form of a "Time Out". The patient was asked to confirm marked site and procedure, before commencing. The patient was asked about blood thinners, or active infections, both of which were denied. Patient was assessed for positional comfort and all pressure points were checked before starting procedure. Monitoring:  As per clinic protocol. Infection Control Precautions: Sterile technique used. Standard Universal Precautions were taken as recommended by the Department of [facility] for Disease Control and Prevention (CDC). Standard pre-surgical skin prep was conducted. Respiratory hygiene and cough etiquette was practiced. Hand hygiene observed. Safe injection practices and needle disposal techniques followed. SDV (single dose vial) medications used. Medications properly checked for expiration dates  and contaminants. Personal protective equipment (PPE) used: Surgical mask. Sterile double glove technique. Radiation resistant gloves. Sterile surgical gloves.  Anesthesia, Analgesia, Anxiolysis: Type: Local Anesthesia Local Anesthetic: Lidocaine 1% Route: Infiltration ([place]/IM) IV Access: Declined Sedation: Declined  Indication(s): Analgesia    Description of Procedure Process:  Time-out: "Time-out" completed before starting procedure, as per protocol. Position: Prone Target Area: Caudal Epidural Canal. Approach: Midline approach. Area Prepped: Entire Posterior Sacrococcygeal Region Prepping solution: ChloraPrep (2% chlorhexidine gluconate and 70% isopropyl alcohol) Safety Precautions: Aspiration looking for blood return was conducted prior to all injections. At no point did we inject any substances, as a needle was being advanced. No attempts were made at seeking any paresthesias. Safe injection practices and needle disposal techniques used. Medications properly checked for expiration dates. SDV (single dose vial) medications used.   Description of the Procedure: Protocol guidelines were followed. The patient was placed in position over the fluoroscopy table. The target area was identified and the area prepped in the usual manner. Skin desensitized using vapocoolant spray. Skin & deeper tissues infiltrated with local anesthetic. Appropriate amount of time allowed to pass for local anesthetics to take effect. The procedure needles were then advanced to the target area. Proper needle placement secured. Negative aspiration confirmed. Solution injected in intermittent fashion, asking for systemic symptoms every 0.5cc of injectate. The injection of medication was done very slowly as the patient did experience considerable pressure suggesting restriction to flow of the injectate. The needles were then removed and the area cleansed, making sure to leave some of the prepping solution back to take advantage  of its long term bactericidal properties. EBL: None Materials & Medications Used:  Needle(s) Used: 20g - 10cm, Tuohy-style epidural needle Medications Administered today: We administered ropivacaine (PF) 2 mg/ml (0.2%), lidocaine (PF), sodium chloride, and triamcinolone acetonide.Please see chart orders  for dosing details.  Imaging Guidance:  Type of Imaging Technique: Fluoroscopy Guidance (Spinal) Indication(s): Assistance in needle guidance and placement for procedures requiring needle placement in or near specific anatomical locations not easily accessible without such assistance. Exposure Time: Please see chart for details. Contrast: Before injecting any contrast, we confirmed that the patient did not have an allergy to iodine, shellfish, or radiological contrast. Once satisfactory needle placement was completed at the desired level, radiological contrast was injected. Injection was conducted under continuous fluoroscopic guidance. Injection of contrast accomplished without complications. See chart for type and volume of contrast used. Fluoroscopic Guidance: I was personally present in the fluoroscopy suite, where the patient was placed in position for the procedure, over the fluoroscopy-compatible table. Fluoroscopy was manipulated, using "Tunnel Vision Technique", to obtain the best possible view of the target area, on the affected side. Parallax error was corrected before commencing the procedure. A "direction-depth-direction" technique was used to introduce the needle under continuous pulsed fluoroscopic guidance. Once the target was reached, antero-posterior, oblique, and lateral fluoroscopic projection views were taken to confirm needle placement in all planes. Permanently recorded images stored by scanning into EMR. Interpretation: Intraoperative imaging interpretation by performing Physician. Adequate needle placement confirmed. Adequate needle placement confirmed in AP, lateral, & Oblique  Views. Appropriate spread of contrast to desired area. No evidence of afferent or efferent intravascular uptake. No intrathecal or subarachnoid spread observed. Permanent hardcopy images in multiple planes scanned into the patient's record. The spread of contrast is observed to have gone through the right side of the caudal canal and then switching to bilateral at the level of the L5-S1 disc. No contrast was observed over the left S1 or S2 nerve roots.  Antibiotic Prophylaxis:  Indication(s): No indications identified. Type:  Antibiotics Given (last 72 hours)    None       Post-operative Assessment:  Complications: No immediate post-treatment complications were observed. Disposition: Return to clinic for follow-up evaluation. The patient tolerated the entire procedure well. A repeat set of vitals were taken after the procedure and the patient was kept under observation following institutional policy, for this procedure. Post-procedural neurological assessment was performed, showing return to baseline, prior to discharge. The patient was discharged home, once institutional criteria were met. The patient was provided with post-procedure discharge instructions, including a section on how to identify potential problems. Should any problems arise concerning this procedure, the patient was given instructions to immediately contact us, at any time, without hesitation. In any case, we plan to contact the patient by telephone for a follow-up status report regarding this interventional procedure. Comments:  No additional relevant information.  Medications administered during this visit: We administered ropivacaine (PF) 2 mg/ml (0.2%), lidocaine (PF), sodium chloride, and triamcinolone acetonide.  Prescriptions ordered during this visit: New Prescriptions   No medications on file    Future Appointments Date Time Provider [place]  10/10/2015 11:00 AM Milinda Pointer, MD [facility] None     Primary Care Physician: Adin Hector, MD Location: [facility] Note by: Kathlen Brunswick. Dossie Arbour, M.D, DABA, DABAPM, DABPM, DABIPP, FIPP  Disclaimer:  Medicine is not an exact science. The only guarantee in medicine is that nothing is guaranteed. It is important to note that the decision to proceed with this intervention was based on the information collected from the patient. The Data and conclusions were drawn from the patient's questionnaire, the interview, and the physical examination. Because the information was provided in large part by the  patient, it cannot be guaranteed that it has not been purposely or unconsciously manipulated. Every effort has been made to obtain as much relevant data as possible for this evaluation. It is important to note that the conclusions that lead to this procedure are derived in large part from the available data. Always take into account that the treatment will also be dependent on availability of resources and existing treatment guidelines, considered by other Pain Management Practitioners as being common knowledge and practice, at the time of the intervention. For Medico-Legal purposes, it is also important to point out that variation in procedural techniques and pharmacological choices are the acceptable norm. The indications, contraindications, technique, and results of the above procedure should only be interpreted and judged by a Board-Certified Interventional Pain Specialist with extensive familiarity and expertise in the same exact procedure and technique. Attempts at providing opinions without similar or greater experience and expertise than that of the treating physician will be considered as inappropriate and unethical, and shall result in a formal complaint to the state medical board and applicable specialty societies.

## 2015-09-20 NOTE — Progress Notes (Signed)
Safety precautions to be maintained throughout the outpatient stay will include: orient to surroundings, keep bed in low position, maintain call bell within reach at all times, provide assistance with transfer out of bed and ambulation.  

## 2015-09-21 ENCOUNTER — Telehealth: Payer: Self-pay | Admitting: *Deleted

## 2015-09-21 NOTE — Telephone Encounter (Signed)
No answer with post procedure phone call. 

## 2015-09-29 DIAGNOSIS — Z8619 Personal history of other infectious and parasitic diseases: Secondary | ICD-10-CM | POA: Insufficient documentation

## 2015-09-29 DIAGNOSIS — E785 Hyperlipidemia, unspecified: Secondary | ICD-10-CM | POA: Insufficient documentation

## 2015-10-10 ENCOUNTER — Ambulatory Visit: Payer: BLUE CROSS/BLUE SHIELD | Admitting: Pain Medicine

## 2015-11-29 ENCOUNTER — Telehealth: Payer: Self-pay | Admitting: *Deleted

## 2015-11-29 NOTE — Telephone Encounter (Signed)
RETURNED PT CALL GAVE AN APPT FOR 12/20/15 @ 10:20AM..Marland Kitchen.TD

## 2015-12-20 ENCOUNTER — Encounter: Payer: Self-pay | Admitting: Pain Medicine

## 2015-12-20 ENCOUNTER — Ambulatory Visit: Payer: BLUE CROSS/BLUE SHIELD | Attending: Pain Medicine | Admitting: Pain Medicine

## 2015-12-20 VITALS — BP 109/66 | HR 62 | Temp 98.4°F | Resp 16 | Ht 68.0 in | Wt 195.0 lb

## 2015-12-20 DIAGNOSIS — M4806 Spinal stenosis, lumbar region: Secondary | ICD-10-CM

## 2015-12-20 DIAGNOSIS — M792 Neuralgia and neuritis, unspecified: Secondary | ICD-10-CM

## 2015-12-20 DIAGNOSIS — M545 Low back pain, unspecified: Secondary | ICD-10-CM

## 2015-12-20 DIAGNOSIS — M1288 Other specific arthropathies, not elsewhere classified, other specified site: Secondary | ICD-10-CM | POA: Insufficient documentation

## 2015-12-20 DIAGNOSIS — E785 Hyperlipidemia, unspecified: Secondary | ICD-10-CM | POA: Diagnosis not present

## 2015-12-20 DIAGNOSIS — G8929 Other chronic pain: Secondary | ICD-10-CM | POA: Diagnosis not present

## 2015-12-20 DIAGNOSIS — Z8619 Personal history of other infectious and parasitic diseases: Secondary | ICD-10-CM | POA: Insufficient documentation

## 2015-12-20 DIAGNOSIS — M533 Sacrococcygeal disorders, not elsewhere classified: Secondary | ICD-10-CM | POA: Diagnosis not present

## 2015-12-20 DIAGNOSIS — Z598 Other problems related to housing and economic circumstances: Secondary | ICD-10-CM | POA: Insufficient documentation

## 2015-12-20 DIAGNOSIS — M5416 Radiculopathy, lumbar region: Secondary | ICD-10-CM

## 2015-12-20 DIAGNOSIS — N138 Other obstructive and reflux uropathy: Secondary | ICD-10-CM | POA: Insufficient documentation

## 2015-12-20 DIAGNOSIS — M47816 Spondylosis without myelopathy or radiculopathy, lumbar region: Secondary | ICD-10-CM

## 2015-12-20 DIAGNOSIS — M79606 Pain in leg, unspecified: Secondary | ICD-10-CM | POA: Diagnosis not present

## 2015-12-20 DIAGNOSIS — N401 Enlarged prostate with lower urinary tract symptoms: Secondary | ICD-10-CM | POA: Insufficient documentation

## 2015-12-20 DIAGNOSIS — Z96651 Presence of right artificial knee joint: Secondary | ICD-10-CM

## 2015-12-20 DIAGNOSIS — M5418 Radiculopathy, sacral and sacrococcygeal region: Secondary | ICD-10-CM

## 2015-12-20 DIAGNOSIS — Z96653 Presence of artificial knee joint, bilateral: Secondary | ICD-10-CM

## 2015-12-20 DIAGNOSIS — M722 Plantar fascial fibromatosis: Secondary | ICD-10-CM | POA: Diagnosis not present

## 2015-12-20 DIAGNOSIS — Z96652 Presence of left artificial knee joint: Secondary | ICD-10-CM

## 2015-12-20 DIAGNOSIS — M5417 Radiculopathy, lumbosacral region: Secondary | ICD-10-CM

## 2015-12-20 DIAGNOSIS — M549 Dorsalgia, unspecified: Secondary | ICD-10-CM | POA: Diagnosis present

## 2015-12-20 DIAGNOSIS — Z599 Problem related to housing and economic circumstances, unspecified: Secondary | ICD-10-CM

## 2015-12-20 DIAGNOSIS — M48061 Spinal stenosis, lumbar region without neurogenic claudication: Secondary | ICD-10-CM

## 2015-12-20 DIAGNOSIS — R972 Elevated prostate specific antigen [PSA]: Secondary | ICD-10-CM | POA: Insufficient documentation

## 2015-12-20 NOTE — Progress Notes (Signed)
Safety precautions to be maintained throughout the outpatient stay will include: orient to surroundings, keep bed in low position, maintain call bell within reach at all times, provide assistance with transfer out of bed and ambulation.  

## 2015-12-20 NOTE — Progress Notes (Signed)
Patient's Name: CORBET HANLEY  Patient type: Established  MRN: [id number]  Service setting: Ambulatory outpatient  DOB: 1950/12/21  Location: [facility]  DOS: 12/20/2015  Primary Care Physician: Curtis Sites III, MD  Note by: Sydnee Levans. Laban Emperor, M.D, DABA, Orson Aloe, DABPM, Olga Coaster, FIPP  Referring Physician: Lynnea Ferrier, MD  Specialty: Board-Certified Interventional Pain Management  Last Visit to Pain Management: 11/29/2015   Primary Reason(s) for Visit: Encounter for prescription drug management (Level of risk: moderate) CC: Back Pain   HPI  Mr. Sayed is a 65 y.o. year old, male patient, who returns today as an established patient. He has Benign prostatic hyperplasia with urinary obstruction; Elevated prostate specific antigen (PSA); Lumbar spondylosis; Chronic low back pain (Location of Primary Source of Pain) (Bilateral) (L>R); Lumbar facet syndrome (Bilateral) (L>R); Chronic pain; Musculoskeletal pain; Chronic lower extremity pain (Location of Secondary source of pain) (Referred Pain) (Bilateral) (L>R); Plantar fasciitis (Bilateral); Abnormal CT scan, lumbar spine; Abnormal x-ray of lumbar spine; Grade 1 Retrolisthesis of L2 over L3; Lumbar central spinal stenosis (L2-3, L3-4); Lumbar lateral recess stenosis (Bilateral L3-4; Left L4-5); 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 His primarily concern today is the Back Pain   Pain Assessment: Self-Reported Pain Score: 2  Reported level is compatible with  observation       Pain Type: Chronic pain Pain Location: Back Pain Orientation: Lower Pain Descriptors / Indicators: Burning, Tingling (tingling is down the leg) Pain Frequency: Constant  The patient comes into the clinics today for pharmacological management of his chronic pain. I last saw this patient on 09/20/2015. The patient  reports that he does not use illicit drugs. His body mass index is 29.66 kg/(m^2).   Mr. Edler comes in today for me to evaluate his case and see what is said that we can offer him. He is interested in "getting rid of his pain", which to start with is not a realistic expectation. However, we also have that he is having a lot of financial problems and this is not helping as well. Because of his financial situation, he did not keep his last appointment and we were unable to really gather the necessary information from the procedure that we did and therefore all of the diagnostic data was lost. To make matters worse, he indicates that after he left the day of the procedure, his car broke down and he actually had to get under the car to repair it. He admits that this was probably not a good idea and that it probably contributed in making things worse and not allowing the medications that we had injected to actually do with that were supposed to do. In addition, because of this issue of his financial situation, he requested to have the last procedure done without sedation and of course now he indicates that he might not being a good idea since it was rather uncomfortable and painful. Today he asked me if he should go see a neurologist or neurosurgeon for his problems. I informed him that he would end up having the same kinds of problems that he is having here where he is  going to limit our abilities to workup his condition and treated since he is having to choose between appropriate care and what he believes to be affordable management.  Interventional Management: On 08/11/2015 the  patient underwent a diagnostic bilateral L2, L3, L4, L5, and S1 medial branch block under fluoroscopic guidance and IV sedation. This provided the patient with 70% relief of the pain for the first hour, followed by 60% relief for the following 4-6 hours, followed by a 20% relief of his pain for approximately another 3 weeks. On 09/20/2015 the patient underwent a diagnostic, midline, caudal epidural steroid injection under fluoroscopic guidance and no sedation. He was scheduled to return to clinics in 2 weeks for follow-up, but due to financial reasons he canceled the appointment.  Date of Last Visit: 09/20/15 Service Provided on Last Visit: Procedure  Controlled Substance Pharmacotherapy Assessment & REMS (Risk Evaluation and Mitigation Strategy)  Analgesic: Currently he is not using any opioid pain medication. The last time he had a prescription filled for him was on 2015, based on his PMP (Prescription Monitoring Program) Pill Count: N/A MME/day: 0 mg/day  Pharmacokinetics: N/A Pharmacodynamics: N/A Monitoring: [place] PMP: Online review of the past 2630-month period conducted. Not applicable Risk Assessment: Aberrant Behavior: None observed today Substance Use Disorder (SUD) Risk Level: Moderate-to-high Risk of opioid abuse or dependence: 0.7-3.0% with doses ? 36 MME/day and 6.1-26% with doses ? 120 MME/day. Opioid Risk Tool (ORT) Score: Total Score: 0 Low Risk for SUD (Score <3) Depression Scale Score: PHQ-2: PHQ-2 Total Score: 0 No depression (0) PHQ-9: PHQ-9 Total Score: 0 No depression (0-4)  Pharmacologic Plan: I'm not planning on starting the patient on any opioids and he has requested that we stay away from them.  Post-Procedure Assessment  Procedure done on last visit: Diagnostic midline caudal epidural steroid injection under fluoroscopic guidance, no sedation. Side-effects or Adverse reactions: None reported Sedation: No sedation used  Results: Ultra-Short Term Relief (First 1  hour after procedure):  (does not remember)  No IV Analgesics or Anxiolytics given, therefore the benefit is completely due to Local Anesthetics Short Term Relief (Initial 4-6 hrs after procedure): 50 % Complete relief confirms area to be the source of pain Long Term Relief : 75 % Long-term benefit would suggest an inflammatory etiology to the pain   Current Relief (Now): 0%  Recurrance of pain could suggest persistent aggravating factors Interpretation of Results: The results of this test are difficult to evaluate since he decided to cancel the return appointment and it is now, 3 months later that he returns. He did complete his postprocedure diary/journal, but we did not have time to discuss it. Because of this on the fact that he does not quite remember at this point the results of the diagnostic injection, it is not likely that this particular procedure had much in terms of diagnostic value. Review of his diary would suggest that it did not help the lower spine and buttocks area. However at the time, he had indicated having difficulty voiding and the pain seems to be primarily in the area of the tailbone.  Laboratory Chemistry  Inflammation Markers No results found for: ESRSEDRATE, CRP  Renal Function Lab Results  Component Value Date   BUN 17 02/26/2014   CREATININE 1.24 02/26/2014   GFRAA >60 02/26/2014   GFRNONAA >60 02/26/2014    Hepatic Function No results found for: AST, ALT, ALBUMIN  Electrolytes Lab Results  Component Value Date   NA 135* 02/26/2014   K  4.1 02/26/2014   CL 102 02/26/2014   CALCIUM 7.9* 02/26/2014    Pain Modulating Vitamins No results found for: VD25OH, VD125OH2TOT, AV4098JX9, JY7829FA2, VITAMINB12  Coagulation Parameters Lab Results  Component Value Date   INR 0.9 02/09/2014   LABPROT 12.4 02/09/2014   APTT 27.1 02/09/2014   PLT 115* 02/26/2014    Note: Labs Reviewed.  Recent Diagnostic Imaging  Dg Lumbar Spine Complete  W/bend  08/15/2015  CLINICAL DATA:  Back pain for months. Weakness radiating down legs. No known injury. EXAM: LUMBAR SPINE - COMPLETE WITH BENDING VIEWS COMPARISON:  07/28/2013 FINDINGS: Degenerative disc disease throughout the lumbar spine, most pronounced at L2-3 with disc space narrowing and spurring. Degenerative facet disease diffusely throughout the lumbar spine, most pronounced from L3-4 through L5-S1. Worsening anterolisthesis of L5 on S1, approximately 5 mm. No instability with flexion or extension. SI joints are symmetric and unremarkable. IMPRESSION: Degenerative disc and facet disease throughout the lumbar spine. Increasing anterolisthesis of L5 on S1 related to facet disease. Electronically Signed   By: Charlett Nose M.D.   On: 08/15/2015 11:50   Lumbosacral Imaging: Lumbar CT wo contrast:  Results for orders placed in visit on 07/14/13  CT Lumbar Spine Wo Contrast   Narrative * PRIOR REPORT IMPORTED FROM AN EXTERNAL SYSTEM *   CLINICAL DATA:  Low back pain for 2 months. Evaluate herniated disc.   EXAM:  CT LUMBAR SPINE WITHOUT CONTRAST   TECHNIQUE:  Multidetector CT imaging of the lumbar spine was performed without  intravenous contrast administration. Multiplanar CT image  reconstructions were also generated.   COMPARISON:  Lumbar spine radiographs 06/15/2013   FINDINGS:  There is trace retrolisthesis of L2 on L3. Vertebral body heights  are preserved without evidence of compression fracture. Small  sclerotic foci in the L3 left superior articular process and T12,  L2, and L5 vertebral bodies are suggestive of bone islands. The  visualized paraspinal soft tissues are unremarkable.   T12-L1:  Negative.   L1-2: Mild to moderate disc space narrowing with anterior spurring.  Mild to moderate circumferential disc bulge without spinal canal or  neural foraminal stenosis.   L2-3: Mild to moderate disc space narrowing with vacuum disc  phenomenon and anterior spurring. Mild  to moderate circumferential  disc bulge flattens the ventral thecal sac with borderline spinal  canal narrowing. There is mild right neural foraminal narrowing.   L3-4: Listhesis and mild disc bulge results in mild spinal stenosis  with lateral recess narrowing. There is mild to moderate facet  arthrosis. No neural foraminal stenosis.   L4-5: Mild disc bulge eccentric to the left results in mild to  moderate left lateral recess narrowing and mild left neural  foraminal narrowing. Mild facet arthrosis.   L5-S1: Mild disc bulge without spinal canal or neural foraminal  stenosis. There is moderate facet arthrosis.   IMPRESSION:  Mild to moderate multilevel degenerative disc disease and facet  arthrosis as above, with the lateral recess narrowing at L3-4  bilaterally and L4-5 on the left.    Electronically Signed    By: Sebastian Ache    On: 07/14/2013 08:48       Lumbar DG 2-3 views:  Results for orders placed in visit on 06/15/13  DG Lumbar Spine 2-3 Views   Narrative * PRIOR REPORT IMPORTED FROM AN EXTERNAL SYSTEM *   CLINICAL DATA:  Low back pain radiating down the right leg. Leg  numbness.   EXAM:  LUMBAR SPINE - 2-3 VIEW  COMPARISON:  None.   FINDINGS:  Alignment is anatomic. Vertebral body height is maintained. Endplate  degenerative changes and slight loss of disc space height at L1-2  and L2-3. Milder endplate degenerative changes at L3-4 and L4-5.  Facet hypertrophy in the lower lumbar spine. Degenerative changes  are also seen in the sacroiliac joints.   IMPRESSION:  Multilevel spondylosis, worst at L1-2 and L2-3.    Electronically Signed    By: Leanna Battles M.D.    On: 06/15/2013 12:40       Lumbar DG Bending views:  Results for orders placed during the hospital encounter of 08/15/15  DG Lumbar Spine Complete W/Bend   Narrative CLINICAL DATA:  Back pain for months. Weakness radiating down legs. No known injury.  EXAM: LUMBAR SPINE - COMPLETE  WITH BENDING VIEWS  COMPARISON:  07/28/2013  FINDINGS: Degenerative disc disease throughout the lumbar spine, most pronounced at L2-3 with disc space narrowing and spurring. Degenerative facet disease diffusely throughout the lumbar spine, most pronounced from L3-4 through L5-S1. Worsening anterolisthesis of L5 on S1, approximately 5 mm. No instability with flexion or extension. SI joints are symmetric and unremarkable.  IMPRESSION: Degenerative disc and facet disease throughout the lumbar spine. Increasing anterolisthesis of L5 on S1 related to facet disease.   Electronically Signed   By: Charlett Nose M.D.   On: 08/15/2015 11:50    Knee Imaging: Knee-R DG 1-2 views:  Results for orders placed in visit on 02/25/14  DG Knee 1-2 Views Right   Narrative * PRIOR REPORT IMPORTED FROM AN EXTERNAL SYSTEM *   CLINICAL DATA:  Postop right knee arthroplasty.   EXAM:  RIGHT KNEE - 1-2 VIEW   COMPARISON:  None.   FINDINGS:  The prosthetic components are well-seated and aligned. There is no  acute fracture or evidence of an operative complication.   IMPRESSION:  Well-aligned right knee prosthesis.    Electronically Signed    By: Amie Portland M.D.    On: 02/25/2014 13:22       Knee-L DG 1-2 views:  Results for orders placed in visit on 08/02/10  DG Knee 1-2 Views Left   Narrative * PRIOR REPORT IMPORTED FROM AN EXTERNAL SYSTEM *   PRIOR REPORT IMPORTED FROM THE [employer] WORKFLOW SYSTEM   REASON FOR EXAM:    post op TKR  COMMENTS:   Bedside (portable):Y   PROCEDURE:     DXR - DXR KNEE LEFT AP AND LATERAL  - Aug 02 2010 11:28AM   RESULT:     The patient is status post left knee replacement. No fracture  about the prosthetic components is seen. There is no dislocation at the  prosthetic knee joint. Surgical drains are present anteriorly.   IMPRESSION:  1.     The patient is status post left knee replacement. No abnormal  postoperative changes are identified.   Thank  you for the opportunity to contribute to the care of your patient.       Note: Results made available to patient.  Meds  The patient has a current medication list which includes the following prescription(s): cyclobenzaprine, finasteride, fish oil, omeprazole, and magnesium (amino acid chelate).  Current Outpatient Prescriptions on File Prior to Visit  Medication Sig  . cyclobenzaprine (FLEXERIL) 10 MG tablet Take 1 tablet (10 mg total) by mouth 3 (three) times daily as needed for muscle spasms.  Marland Kitchen FINASTERIDE PO Take 5 mg by mouth every morning.  . Omega-3 Fatty Acids (FISH  OIL) 1000 MG CPDR Take 1,000 mg by mouth every morning.  Marland Kitchen omeprazole (PRILOSEC) 20 MG capsule Take 20 mg by mouth every morning.  Marland Kitchen Specialty Vitamins Products (MAGNESIUM, AMINO ACID CHELATE,) 133 MG tablet Take 1 tablet by mouth daily.   No current facility-administered medications on file prior to visit.    ROS  Constitutional: Denies any fever or chills Gastrointestinal: No reported hemesis, hematochezia, vomiting, or acute GI distress Musculoskeletal: Denies any acute onset joint swelling, redness, loss of ROM, or weakness Neurological: No reported episodes of acute onset apraxia, aphasia, dysarthria, agnosia, amnesia, paralysis, loss of coordination, or loss of consciousness  Allergies  Mr. Botsford is allergic to penicillins.  PFSH  Medical:  Mr. Linch  has a past medical history of Allergy and Hepatitis C. Family: family history includes Arthritis in his mother; Cancer in his father; Stroke in his father. Surgical:  has past surgical history that includes Joint replacement; Hernia repair; and Prostate surgery. Tobacco:  reports that he has quit smoking. He does not have any smokeless tobacco history on file. Alcohol:  reports that he drinks alcohol. Drug:  reports that he does not use illicit drugs.  Constitutional Exam  Vitals: Blood pressure 109/66, pulse 62, temperature 98.4 F (36.9 C),  temperature source Oral, resp. rate 16, height 5\' 8"  (1.727 m), weight 195 lb (88.451 kg), SpO2 100 %. General appearance: Well nourished, well developed, and well hydrated. In no acute distress Calculated BMI/Body habitus: Body mass index is 29.66 kg/(m^2). (25-29.9 kg/m2) Overweight - 20% higher incidence of chronic pain Psych/Mental status: Alert and oriented x 3 (person, place, & time) Eyes: PERLA Respiratory: No evidence of acute respiratory distress  Cervical Spine Exam  Inspection: No masses, redness, or swelling Alignment: Symmetrical ROM: Functional: ROM is within functional limits [facility]) Stability: No instability detected Muscle strength & Tone: Functionally intact Sensory: Unimpaired Palpation: No complaints of tenderness  Upper Extremity (UE) Exam    Side: Right upper extremity  Side: Left upper extremity  Inspection: No masses, redness, swelling, or asymmetry  Inspection: No masses, redness, swelling, or asymmetry  ROM:  ROM:  Functional: ROM is within functional limits [facility])  Functional: ROM is within functional limits [facility])  Muscle strength & Tone: Functionally intact  Muscle strength & Tone: Functionally intact  Sensory: Unimpaired  Sensory: Unimpaired  Palpation: Non-contributory  Palpation: Non-contributory   Thoracic Spine Exam  Inspection: No masses, redness, or swelling Alignment: Symmetrical ROM: Functional: ROM is within functional limits [facility]) Stability: No instability detected Sensory: Unimpaired Muscle strength & Tone: Functionally intact Palpation: No complaints of tenderness  Lumbar Spine Exam  Inspection: No masses, redness, or swelling Alignment: Symmetrical ROM: Functional: Decreased ROM Stability: No instability detected Muscle strength & Tone: Functionally intact Sensory: Unimpaired Palpation: Tender Provocative Tests: Lumbar Hyperextension and rotation test: Positive for bilateral lumbar facet pain Patrick's Maneuver: deferred  Gait &  Posture Assessment  Ambulation: Unassisted Gait: Unaffected Posture: WNL  Lower Extremity Exam    Side: Right lower extremity  Side: Left lower extremity  Inspection: No masses, redness, swelling, or asymmetry ROM:  Inspection: No masses, redness, swelling, or asymmetry ROM:  Functional: ROM is within functional limits [facility])  Functional: ROM is within functional limits [facility])  Muscle strength & Tone: Functionally intact  Muscle strength & Tone: Functionally intact  Sensory: Unimpaired  Sensory: Unimpaired  Palpation: Non-contributory  Palpation: Non-contributory   Assessment & Plan  Primary Diagnosis & Pertinent Problem List: The primary encounter diagnosis was Lumbar facet arthropathy (  L3-4, L4-5, L5-S1). Diagnoses of Neurogenic pain, Lumbar spondylosis, unspecified spinal osteoarthritis, Status post total right knee replacement, TKR (total knee replacement) (Left), TKR (total knee replacement) (Bilateral), Financial difficulties affecting health care., Sacral radiculitis, Coccygodynia (tailbone pain), Chronic low back pain (Location of Primary Source of Pain) (Bilateral) (L>R), Chronic pain of lower extremity, unspecified laterality, Chronic lumbar radicular pain (Location of Secondary source of pain) (Bilateral) (L>R), and Lumbar central spinal stenosis (L2-3, L3-4) were also pertinent to this visit.  Visit Diagnosis: 1. Lumbar facet arthropathy (L3-4, L4-5, L5-S1)   2. Neurogenic pain   3. Lumbar spondylosis, unspecified spinal osteoarthritis   4. Status post total right knee replacement   5. TKR (total knee replacement) (Left)   6. TKR (total knee replacement) (Bilateral)   7. Financial difficulties affecting health care.   8. Sacral radiculitis   9. Coccygodynia (tailbone pain)   10. Chronic low back pain (Location of Primary Source of Pain) (Bilateral) (L>R)   11. Chronic pain of lower extremity, unspecified laterality   12. Chronic lumbar radicular pain (Location of Secondary  source of pain) (Bilateral) (L>R)   13. Lumbar central spinal stenosis (L2-3, L3-4)     Problems updated and reviewed during this visit: Problem  Lumbar facet arthropathy (L3-4, L4-5, L5-S1)  Neurogenic Pain  TKR (total knee replacement) (Bilateral)  Sacral Radiculitis  Coccygodynia (tailbone pain)  Lumbar Spondylosis   Disc bulge: L1-2, L2-3, L3-4, L4-5, and L5-S1. Facet arthrosis: L2-3, L4-5, and L5-S1. Vacuum disc phenomenon: L2-3 Neural foraminal narrowing:  Right-sided: L2-3  Left-sided: L4-5 Lateral recess stenosis:  Right-sided: L3-4  Left-sided: L3-4 and L4-5 Central spinal stenosis: L2-3, L3-4 Spondylosis (DDD with loss of disc height): L1-2, L2-3 Anterolisthesis: L5 over S1 (5 mm)   Lumbar lateral recess stenosis (Bilateral L3-4; Left L4-5)  Lumbar foraminal stenosis (Right L2-3; Left L4-5)  Financial difficulties affecting health care.  Personal History of Other Infectious and Parasitic Diseases   Overview:  GT 2 HCV Load [id number] F0 on Liver Elastography. S/p 12 weeks of Sovaldi & Ribavirin(Completed 10/2014) SVR at 24 weeks.  Viral negative at 48 weeks.     Hld (Hyperlipidemia)    Problem-specific Plan(s): No problem-specific assessment & plan notes found for this encounter.  No new assessment & plan notes have been filed under this hospital service since the last note was generated. Service: Pain Management   Plan of Care   Problem List Items Addressed This Visit      High   Chronic low back pain (Location of Primary Source of Pain) (Bilateral) (L>R) (Chronic)   Chronic lower extremity pain (Location of Secondary source of pain) (Referred Pain) (Bilateral) (L>R) (Chronic)   Relevant Orders   LUMBAR EPIDURAL STEROID INJECTION   Chronic lumbar radicular pain (Location of Secondary source of pain) (Bilateral) (L>R) (Chronic)   Relevant Orders   LUMBAR EPIDURAL STEROID INJECTION   Coccygodynia (tailbone pain) (Chronic)   Relevant Orders   SELECTIVE  NERVE ROOT   Lumbar central spinal stenosis (L2-3, L3-4) (Chronic)   Lumbar facet arthropathy (L3-4, L4-5, L5-S1) - Primary (Chronic)   Relevant Orders   LUMBAR FACET(MEDIAL BRANCH NERVE BLOCK) MBNB   Lumbar spondylosis (Chronic)   Neurogenic pain (Chronic)   Sacral radiculitis (Chronic)   TKR (total knee replacement) (Bilateral)     Low   Financial difficulties affecting health care.    Other Visit Diagnoses    Status post total right knee replacement        TKR (total knee replacement) (Left)  Pharmacotherapy (Medications Ordered): No orders of the defined types were placed in this encounter.    Lab-work & Procedure Ordered: Orders Placed This Encounter  Procedures  . LUMBAR EPIDURAL STEROID INJECTION  . SELECTIVE NERVE ROOT  . LUMBAR FACET(MEDIAL BRANCH NERVE BLOCK) MBNB    Imaging Ordered: None  Interventional Therapies: Scheduled:  None at this time.    Considering:   1. Diagnostic bilateral lumbar facet block under fluoroscopic guidance and IV sedation #2.  2. Possible bilateral lumbar facet radiofrequency ablation under fluoroscopic guidance and IV sedation, depending on the results of the second diagnostic injection.  3. Diagnostic L2-3 and/or L3-4 lumbar epidural steroid injection under fluoroscopic guidance, with or without sedation.  4. Diagnostic right-sided L2-3 transforaminal epidural steroid injection under fluoroscopic guidance, with or without sedation.  5. Diagnostic left sided L4-5 transforaminal epidural steroid injection under fluoroscopic guidance, without or without sedation.  6. Diagnostic bilateral L3-4 transforaminal epidural steroid injection under fluoroscopic guidance, with or without sedation.  7. Diagnostic left sided L4-5 lumbar epidural steroid injection under fluoroscopic guidance, with or without sedation.  8. Diagnostic caudal epidural steroid injection + epidurogram under fluoroscopic guidance, with or without IV sedation.   9. Diagnostic sacrococcygeal nerve block under fluoroscopic guidance, with or without sedation.  10. Possible sacrococcygeal nerve radiofrequency ablation under fluoroscopic guidance and IV sedation depending on the results of the diagnostic injection.    PRN Procedures:   1. Diagnostic bilateral lumbar facet block under fluoroscopic guidance and IV sedation #2. 2. Diagnostic caudal epidural steroid injection + epidurogram under fluoroscopic guidance, with or without IV sedation.  3. Diagnostic sacrococcygeal nerve block under fluoroscopic guidance, with or without sedation.    Referral(s) or Consult(s): None at this time.  New Prescriptions   No medications on file    Medications administered during this visit: Mr. Hoon had no medications administered during this visit.  Requested PM Follow-up: Return for Procedure (PRN - Patient will call).  No future appointments.  Primary Care Physician: Lynnea Ferrier, MD Location: [facility] Note by: Sydnee Levans Laban Emperor, M.D, DABA, DABAPM, DABPM, DABIPP, FIPP  Pain Score Disclaimer: We use the NRS-11 scale. This is a self-reported, subjective measurement of pain severity with only modest accuracy. It is used primarily to identify changes within a particular patient. It must be understood that outpatient pain scales are significantly less accurate that those used for research, where they can be applied under ideal controlled circumstances with minimal exposure to variables. In reality, the score is likely to be a combination of pain intensity and pain affect, where pain affect describes the degree of emotional arousal or changes in action readiness caused by the sensory experience of pain. Factors such as social and work situation, setting, emotional state, anxiety levels, expectation, and prior pain experience may influence pain perception and show large inter-individual differences that may also be affected by  time variables.  Patient instructions provided during this appointment: There are no Patient Instructions on file for this visit.

## 2015-12-20 NOTE — Progress Notes (Deleted)
   Subjective:    Patient ID: Keith Fuller, male    DOB: [birth year], 65 y.o.   MRN: [id number]  HPI    Review of Systems     Objective:   Physical Exam        Assessment & Plan:

## 2016-02-13 DIAGNOSIS — M47816 Spondylosis without myelopathy or radiculopathy, lumbar region: Secondary | ICD-10-CM | POA: Insufficient documentation

## 2016-03-02 DIAGNOSIS — M79642 Pain in left hand: Secondary | ICD-10-CM

## 2016-03-02 DIAGNOSIS — M79641 Pain in right hand: Secondary | ICD-10-CM | POA: Insufficient documentation

## 2016-03-07 ENCOUNTER — Other Ambulatory Visit: Payer: Self-pay | Admitting: Neurosurgery

## 2016-03-07 DIAGNOSIS — M4317 Spondylolisthesis, lumbosacral region: Secondary | ICD-10-CM

## 2016-03-12 DIAGNOSIS — M159 Polyosteoarthritis, unspecified: Secondary | ICD-10-CM | POA: Insufficient documentation

## 2016-03-19 ENCOUNTER — Ambulatory Visit
Admission: RE | Admit: 2016-03-19 | Discharge: 2016-03-19 | Disposition: A | Discharge status: Home / Self Care | Payer: BLUE CROSS/BLUE SHIELD | Source: Ambulatory Visit | Attending: Neurosurgery | Admitting: Neurosurgery

## 2016-03-19 VITALS — BP 140/88 | HR 61

## 2016-03-19 DIAGNOSIS — M5418 Radiculopathy, sacral and sacrococcygeal region: Secondary | ICD-10-CM

## 2016-03-19 DIAGNOSIS — G8929 Other chronic pain: Secondary | ICD-10-CM

## 2016-03-19 DIAGNOSIS — M4317 Spondylolisthesis, lumbosacral region: Secondary | ICD-10-CM

## 2016-03-19 DIAGNOSIS — M47816 Spondylosis without myelopathy or radiculopathy, lumbar region: Secondary | ICD-10-CM

## 2016-03-19 DIAGNOSIS — M5416 Radiculopathy, lumbar region: Secondary | ICD-10-CM

## 2016-03-19 DIAGNOSIS — M48061 Spinal stenosis, lumbar region without neurogenic claudication: Secondary | ICD-10-CM

## 2016-03-19 DIAGNOSIS — M4310 Spondylolisthesis, site unspecified: Secondary | ICD-10-CM

## 2016-03-19 MED ORDER — DIAZEPAM 5 MG PO TABS
10.0000 mg | ORAL_TABLET | Freq: Once | ORAL | Status: AC
  Administered 2016-03-19: 10 mg via ORAL

## 2016-03-19 MED ORDER — ONDANSETRON HCL 4 MG/2ML IJ SOLN: 4.0000 mg | Freq: Four times a day (QID) | INTRAMUSCULAR | Status: DC | PRN

## 2016-03-19 MED ORDER — IOPAMIDOL (ISOVUE-M 200) INJECTION 41%: 15.0000 mL | Freq: Once | INTRAMUSCULAR | Status: DC

## 2016-03-19 MED ORDER — MEPERIDINE HCL 100 MG/ML IJ SOLN
75.0000 mg | Freq: Once | INTRAMUSCULAR | Status: AC
  Administered 2016-03-19: 75 mg via INTRAMUSCULAR

## 2016-03-19 MED ORDER — ONDANSETRON HCL 4 MG/2ML IJ SOLN
4.0000 mg | Freq: Once | INTRAMUSCULAR | Status: AC
  Administered 2016-03-19: 4 mg via INTRAMUSCULAR

## 2016-03-19 NOTE — Discharge Instructions (Signed)

## 2016-03-21 ENCOUNTER — Telehealth: Payer: Self-pay

## 2016-03-21 NOTE — Telephone Encounter (Signed)
LM on patient's home machine asking how he is doing after his procedure (myelogram) here 03/19/16.  Tomasa Blasejkl

## 2016-04-25 ENCOUNTER — Telehealth: Payer: Self-pay | Admitting: *Deleted

## 2016-04-25 NOTE — Telephone Encounter (Signed)
[employer] - no prior auth required for outpatient Caudal epidural - okay to schedule

## 2016-04-25 NOTE — Telephone Encounter (Signed)
Transferred to secretaries for scheduling.

## 2016-04-25 NOTE — Telephone Encounter (Signed)
Spoke with patient, he is having pain in his back that goes all the way down to the soles of his feet.  Would like to schedule for a caudal epidural with sedation.  Will forward to Angie to see if PA is needed.

## 2016-05-01 ENCOUNTER — Ambulatory Visit
Admission: RE | Admit: 2016-05-01 | Discharge: 2016-05-01 | Disposition: A | Discharge status: Home / Self Care | Payer: BLUE CROSS/BLUE SHIELD | Source: Ambulatory Visit | Attending: Pain Medicine | Admitting: Pain Medicine

## 2016-05-01 ENCOUNTER — Ambulatory Visit (HOSPITAL_BASED_OUTPATIENT_CLINIC_OR_DEPARTMENT_OTHER): Payer: BLUE CROSS/BLUE SHIELD | Admitting: Pain Medicine

## 2016-05-01 ENCOUNTER — Encounter: Payer: Self-pay | Admitting: Pain Medicine

## 2016-05-01 VITALS — BP 142/78 | HR 63 | Temp 96.3°F | Resp 16 | Ht 69.0 in | Wt 204.0 lb

## 2016-05-01 DIAGNOSIS — M5416 Radiculopathy, lumbar region: Secondary | ICD-10-CM

## 2016-05-01 DIAGNOSIS — K5900 Constipation, unspecified: Secondary | ICD-10-CM

## 2016-05-01 DIAGNOSIS — R198 Other specified symptoms and signs involving the digestive system and abdomen: Secondary | ICD-10-CM | POA: Insufficient documentation

## 2016-05-01 DIAGNOSIS — N50819 Testicular pain, unspecified: Secondary | ICD-10-CM | POA: Insufficient documentation

## 2016-05-01 DIAGNOSIS — R39198 Other difficulties with micturition: Secondary | ICD-10-CM

## 2016-05-01 DIAGNOSIS — G8929 Other chronic pain: Secondary | ICD-10-CM | POA: Diagnosis not present

## 2016-05-01 DIAGNOSIS — M5418 Radiculopathy, sacral and sacrococcygeal region: Secondary | ICD-10-CM | POA: Insufficient documentation

## 2016-05-01 DIAGNOSIS — M79606 Pain in leg, unspecified: Secondary | ICD-10-CM

## 2016-05-01 MED ORDER — ROPIVACAINE HCL 2 MG/ML IJ SOLN
INTRAMUSCULAR | Status: AC
  Administered 2016-05-01: 11:00:00
  Filled 2016-05-01: qty 10

## 2016-05-01 MED ORDER — LIDOCAINE HCL (PF) 1 % IJ SOLN: 10.0000 mL | Freq: Once | INTRAMUSCULAR | Status: DC

## 2016-05-01 MED ORDER — LACTATED RINGERS IV SOLN: 1000.0000 mL | Freq: Once | INTRAVENOUS | Status: DC

## 2016-05-01 MED ORDER — ROPIVACAINE HCL 2 MG/ML IJ SOLN: 2.0000 mL | Freq: Once | INTRAMUSCULAR | Status: DC

## 2016-05-01 MED ORDER — FENTANYL CITRATE (PF) 100 MCG/2ML IJ SOLN
INTRAMUSCULAR | Status: AC
  Administered 2016-05-01: 100 ug via INTRAVENOUS
  Filled 2016-05-01: qty 2

## 2016-05-01 MED ORDER — MIDAZOLAM HCL 5 MG/5ML IJ SOLN: 1.0000 mg | INTRAMUSCULAR | Status: DC | PRN

## 2016-05-01 MED ORDER — MIDAZOLAM HCL 5 MG/5ML IJ SOLN
INTRAMUSCULAR | Status: AC
Start: 2016-05-01 — End: 2016-05-01
  Administered 2016-05-01: 5 mg via INTRAVENOUS
  Filled 2016-05-01: qty 5

## 2016-05-01 MED ORDER — IOPAMIDOL (ISOVUE-M 200) INJECTION 41%: 10.0000 mL | Freq: Once | INTRAMUSCULAR | Status: DC

## 2016-05-01 MED ORDER — METHYLPREDNISOLONE ACETATE 40 MG/ML IJ SUSP
INTRAMUSCULAR | Status: AC
  Administered 2016-05-01: 11:00:00
  Filled 2016-05-01: qty 1

## 2016-05-01 MED ORDER — LIDOCAINE HCL (PF) 1 % IJ SOLN
INTRAMUSCULAR | Status: AC
  Administered 2016-05-01: 11:00:00
  Filled 2016-05-01: qty 5

## 2016-05-01 MED ORDER — TRIAMCINOLONE ACETONIDE 40 MG/ML IJ SUSP
INTRAMUSCULAR | Status: AC
  Administered 2016-05-01: 11:00:00
  Filled 2016-05-01: qty 1

## 2016-05-01 MED ORDER — IOPAMIDOL (ISOVUE-M 200) INJECTION 41%
INTRAMUSCULAR | Status: AC
  Administered 2016-05-01: 11:00:00
  Filled 2016-05-01: qty 10

## 2016-05-01 MED ORDER — FENTANYL CITRATE (PF) 100 MCG/2ML IJ SOLN: 25.0000 ug | INTRAMUSCULAR | Status: DC | PRN

## 2016-05-01 MED ORDER — SODIUM CHLORIDE 0.9% FLUSH: 2.0000 mL | Freq: Once | INTRAVENOUS | Status: DC

## 2016-05-01 MED ORDER — TRIAMCINOLONE ACETONIDE 40 MG/ML IJ SUSP: 40.0000 mg | Freq: Once | INTRAMUSCULAR | Status: DC

## 2016-05-01 MED ORDER — SODIUM CHLORIDE 0.9 % IJ SOLN
INTRAMUSCULAR | Status: AC
  Administered 2016-05-01: 11:00:00
  Filled 2016-05-01: qty 10

## 2016-05-01 NOTE — Progress Notes (Signed)
Safety precautions to be maintained throughout the outpatient stay will include: orient to surroundings, keep bed in low position, maintain call bell within reach at all times, provide assistance with transfer out of bed and ambulation.  
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
## 2016-05-01 NOTE — Patient Instructions (Signed)
Pain Management Discharge Instructions  General Discharge Instructions :  If you need to reach your doctor call: Monday-Friday 8:00 am - 4:00 pm at [phone] or toll free [phone].  After clinic hours [phone] to have operator reach doctor.  Bring all of your medication bottles to all your appointments in the pain clinic.  To cancel or reschedule your appointment with Pain Management please remember to call 24 hours in advance to avoid a fee.  Refer to the educational materials which you have been given on: General Risks, I had my Procedure. Discharge Instructions, Post Sedation.  Post Procedure Instructions:  The drugs you were given will stay in your system until tomorrow, so for the next 24 hours you should not drive, make any legal decisions or drink any alcoholic beverages.  You may eat anything you prefer, but it is better to start with liquids then soups and crackers, and gradually work up to solid foods.  Please notify your doctor immediately if you have any unusual bleeding, trouble breathing or pain that is not related to your normal pain.  Depending on the type of procedure that was done, some parts of your body may feel week and/or numb.  This usually clears up by tonight or the next day.  Walk with the use of an assistive device or accompanied by an adult for the 24 hours.  You may use ice on the affected area for the first 24 hours.  Put ice in a Ziploc bag and cover with a towel and place against area 15 minutes on 15 minutes off.  You may switch to heat after 24 hours.Epidural Steroid Injection An epidural steroid injection is given to relieve pain in your neck, back, or legs that is caused by the irritation or swelling of a nerve root. This procedure involves injecting a steroid and numbing medicine (anesthetic) into the epidural space. The epidural space is the space between the outer covering of your spinal cord and the bones that form your backbone  (vertebra).  LET YOUR HEALTH CARE PROVIDER KNOW ABOUT:   Any allergies you have.  All medicines you are taking, including vitamins, herbs, eye drops, creams, and over-the-counter medicines such as aspirin.  Previous problems you or members of your family have had with the use of anesthetics.  Any blood disorders or blood clotting disorders you have.  Previous surgeries you have had.  Medical conditions you have. RISKS AND COMPLICATIONS Generally, this is a safe procedure. However, as with any procedure, complications can occur. Possible complications of epidural steroid injection include:  Headache.  Bleeding.  Infection.  Allergic reaction to the medicines.  Damage to your nerves. The response to this procedure depends on the underlying cause of the pain and its duration. People who have long-term (chronic) pain are less likely to benefit from epidural steroids than are those people whose pain comes on strong and suddenly. BEFORE THE PROCEDURE   Ask your health care provider about changing or stopping your regular medicines. You may be advised to stop taking blood-thinning medicines a few days before the procedure.  You may be given medicines to reduce anxiety.  Arrange for someone to take you home after the procedure. PROCEDURE   You will remain awake during the procedure. You may receive medicine to make you relaxed.  You will be asked to lie on your stomach.  The injection site will be cleaned.  The injection site will be numbed with a medicine (local anesthetic).  A needle will be   injected through your skin into the epidural space.  Your health care provider will use an X-ray machine to ensure that the steroid is delivered closest to the affected nerve. You may have minimal discomfort at this time.  Once the needle is in the right position, the local anesthetic and the steroid will be injected into the epidural space.  The needle will then be removed and a  bandage will be applied to the injection site. AFTER THE PROCEDURE   You may be monitored for a short time before you go home.  You may feel weakness or numbness in your arm or leg, which disappears within hours.  You may be allowed to eat, drink, and take your regular medicine.  You may have soreness at the site of the injection.   This information is not intended to replace advice given to you by your health care provider. Make sure you discuss any questions you have with your health care provider.   Document Released: 09/25/2007 Document Revised: 02/18/2013 Document Reviewed: 12/05/2012 Elsevier Interactive Patient Education 2016 [employer]  

## 2016-05-02 ENCOUNTER — Telehealth: Payer: Self-pay | Admitting: *Deleted

## 2016-05-02 NOTE — Telephone Encounter (Signed)
Spoke with patient's wife, no problems post procedure. 

## 2016-05-18 ENCOUNTER — Encounter: Payer: Self-pay | Admitting: *Deleted

## 2016-05-21 ENCOUNTER — Encounter
Admission: RE | Disposition: A | Discharge status: Home / Self Care | Payer: Self-pay | Source: Ambulatory Visit | Attending: Unknown Physician Specialty

## 2016-05-21 ENCOUNTER — Ambulatory Visit: Payer: BLUE CROSS/BLUE SHIELD | Admitting: Anesthesiology

## 2016-05-21 ENCOUNTER — Encounter: Payer: Self-pay | Admitting: *Deleted

## 2016-05-21 ENCOUNTER — Ambulatory Visit
Admission: RE | Admit: 2016-05-21 | Discharge: 2016-05-21 | Disposition: A | Discharge status: Home / Self Care | Payer: BLUE CROSS/BLUE SHIELD | Source: Ambulatory Visit | Attending: Unknown Physician Specialty | Admitting: Unknown Physician Specialty

## 2016-05-21 DIAGNOSIS — Z880 Allergy status to penicillin: Secondary | ICD-10-CM | POA: Diagnosis not present

## 2016-05-21 DIAGNOSIS — B1920 Unspecified viral hepatitis C without hepatic coma: Secondary | ICD-10-CM | POA: Diagnosis not present

## 2016-05-21 DIAGNOSIS — K635 Polyp of colon: Secondary | ICD-10-CM | POA: Insufficient documentation

## 2016-05-21 DIAGNOSIS — K640 First degree hemorrhoids: Secondary | ICD-10-CM | POA: Diagnosis not present

## 2016-05-21 DIAGNOSIS — M1990 Unspecified osteoarthritis, unspecified site: Secondary | ICD-10-CM | POA: Diagnosis not present

## 2016-05-21 DIAGNOSIS — Z87891 Personal history of nicotine dependence: Secondary | ICD-10-CM | POA: Insufficient documentation

## 2016-05-21 DIAGNOSIS — Z1211 Encounter for screening for malignant neoplasm of colon: Secondary | ICD-10-CM | POA: Diagnosis not present

## 2016-05-21 HISTORY — PX: COLONOSCOPY WITH PROPOFOL: SHX5780

## 2016-05-21 HISTORY — DX: Unspecified osteoarthritis, unspecified site: M19.90

## 2016-05-21 SURGERY — COLONOSCOPY WITH PROPOFOL
Anesthesia: General

## 2016-05-21 MED ORDER — SODIUM CHLORIDE 0.9 % IV SOLN: INTRAVENOUS | Status: DC

## 2016-05-21 MED ORDER — DEXTROSE 5 % IV SOLN
90.0000 mg | Freq: Once | INTRAVENOUS | Status: AC
  Administered 2016-05-21: 90 mg via INTRAVENOUS
  Filled 2016-05-21: qty 2.25

## 2016-05-21 MED ORDER — PROPOFOL 10 MG/ML IV BOLUS
INTRAVENOUS | Status: DC | PRN
  Administered 2016-05-21: 50 mg via INTRAVENOUS
  Administered 2016-05-21: 30 mg via INTRAVENOUS

## 2016-05-21 MED ORDER — PROPOFOL 500 MG/50ML IV EMUL
INTRAVENOUS | Status: DC | PRN
  Administered 2016-05-21: 150 ug/kg/min via INTRAVENOUS

## 2016-05-21 MED ORDER — SODIUM CHLORIDE 0.9 % IV SOLN
INTRAVENOUS | Status: DC
  Administered 2016-05-21: 10:00:00 via INTRAVENOUS

## 2016-05-21 MED ORDER — PHENYLEPHRINE HCL 10 MG/ML IJ SOLN
INTRAMUSCULAR | Status: DC | PRN
  Administered 2016-05-21: 100 ug via INTRAVENOUS

## 2016-05-21 MED ORDER — FENTANYL CITRATE (PF) 100 MCG/2ML IJ SOLN
INTRAMUSCULAR | Status: DC | PRN
  Administered 2016-05-21: 50 ug via INTRAVENOUS

## 2016-05-21 MED ORDER — VANCOMYCIN HCL IN DEXTROSE 750-5 MG/150ML-% IV SOLN
750.0000 mg | Freq: Once | INTRAVENOUS | Status: AC
  Administered 2016-05-21: 750 mg via INTRAVENOUS
  Filled 2016-05-21: qty 150

## 2016-05-21 NOTE — Op Note (Signed)
[facility] Gastroenterology Patient Name: Keith DikeFrederick Fuller Procedure Date: 05/21/2016 11:16 AM MRN: [id number] Account #: [id number] Date of Birth: [birth year] Admit Type: Outpatient Age: [age] Room: [facility] ENDO ROOM 1 Gender: Male Note Status: Finalized Procedure:            Colonoscopy Indications:          Screening for colorectal malignant neoplasm Providers:            Scot Junobert T. Von Inscoe, MD Referring MD:         Daniel NonesBert Klein, MD (Referring MD) Medicines:            Propofol per Anesthesia Complications:        No immediate complications. Procedure:            Pre-Anesthesia Assessment:                       - After reviewing the risks and benefits, the patient                        was deemed in satisfactory condition to undergo the                        procedure.                       After obtaining informed consent, the colonoscope was                        passed under direct vision. Throughout the procedure,                        the patient's blood pressure, pulse, and oxygen                        saturations were monitored continuously. The                        Colonoscope was introduced through the anus and                        advanced to the the cecum, identified by appendiceal                        orifice and ileocecal valve. The colonoscopy was                        performed without difficulty. The patient tolerated the                        procedure well. The quality of the bowel preparation                        was good. Findings:      A diminutive polyp was found in the splenic flexure. The polyp was       sessile. The polyp was removed with a jumbo cold forceps. Resection and       retrieval were complete.      Multiple small-mouthed diverticula were found in the sigmoid colon and       descending colon.      Internal hemorrhoids were found during endoscopy. The hemorrhoids were  small and Grade I (internal hemorrhoids  that do not prolapse).      The exam was otherwise without abnormality. Impression:           - One diminutive polyp at the splenic flexure, removed                        with a jumbo cold forceps. Resected and retrieved.                       - Diverticulosis in the sigmoid colon and in the                        descending colon.                       - Internal hemorrhoids.                       - The examination was otherwise normal. Recommendation:       - Await pathology results. Scot Junobert T Aysha Livecchi, MD 05/21/2016 11:57:24 AM This report has been signed electronically. Number of Addenda: 0 Note Initiated On: 05/21/2016 11:16 AM Scope Withdrawal Time: 0 hours 9 minutes 43 seconds  Total Procedure Duration: 0 hours 16 minutes 8 seconds       [facility]

## 2016-05-21 NOTE — H&P (Signed)
   Primary Care Physician:  Lynnea FerrierBERT J KLEIN III, MD Primary Gastroenterologist:  Dr. Mechele CollinElliott  Pre-Procedure History & Physical: HPI:  Keith MarrowFrederick L Kiker is a 65 y.o. male is here for an colonoscopy.   Past Medical History:  Diagnosis Date  . Allergy   . Arthritis   . Hepatitis C    denies any virus in system now    Past Surgical History:  Procedure Laterality Date  . HERNIA REPAIR    . JOINT REPLACEMENT    . PROSTATE SURGERY     biopsy    Prior to Admission medications   Medication Sig Start Date End Date Taking? Authorizing Provider  FINASTERIDE PO Take 5 mg by mouth every morning.   Yes Historical Provider, MD  naproxen sodium (ANAPROX) 220 MG tablet Take by mouth.   Yes Historical Provider, MD  Omega-3 Fatty Acids (FISH OIL) 1000 MG CPDR Take 1,000 mg by mouth every morning.   Yes Historical Provider, MD  omeprazole (PRILOSEC) 20 MG capsule Take 20 mg by mouth every morning.   Yes Historical Provider, MD  Specialty Vitamins Products (MAGNESIUM, AMINO ACID CHELATE,) 133 MG tablet Take 1 tablet by mouth daily.   Yes Historical Provider, MD  cyclobenzaprine (FLEXERIL) 10 MG tablet Take 1 tablet (10 mg total) by mouth 3 (three) times daily as needed for muscle spasms. 08/10/15   Delano MetzFrancisco Naveira, MD  predniSONE (DELTASONE) 10 MG tablet Take 10 mg by mouth daily with breakfast.    Historical Provider, MD    Allergies as of 03/22/2016 - Review Complete 03/19/2016  Allergen Reaction Noted  . Penicillins  04/19/2015    Family History  Problem Relation Age of Onset  . Arthritis Mother   . Cancer Father   . Stroke Father     Social History   Social History  . Marital status: Married    Spouse name: N/A  . Number of children: N/A  . Years of education: N/A   Occupational History  . Not on file.   Social History Main Topics  . Smoking status: Former Games developermoker  . Smokeless tobacco: Never Used  . Alcohol use 0.0 oz/week  . Drug use: No  . Sexual activity: Not on file    Other Topics Concern  . Not on file   Social History Narrative  . No narrative on file    Review of Systems: See HPI, otherwise negative ROS  Physical Exam: BP 122/84   Pulse 62   Temp 97.1 F (36.2 C) (Tympanic)   Resp (!) 21   Ht 5\' 9"  (1.753 m)   Wt 92.5 kg (204 lb)   SpO2 100%   BMI 30.13 kg/m  General:   Alert,  pleasant and cooperative in NAD Head:  Normocephalic and atraumatic. Neck:  Supple; no masses or thyromegaly. Lungs:  Clear throughout to auscultation.    Heart:  Regular rate and rhythm. Abdomen:  Soft, nontender and nondistended. Normal bowel sounds, without guarding, and without rebound.   Neurologic:  Alert and  oriented x4;  grossly normal neurologically.  Impression/Plan: Keith Fuller is here for an colonoscopy to be performed for screening colonoscopy  Risks, benefits, limitations, and alternatives regarding  colonoscopy have been reviewed with the patient.  Questions have been answered.  All parties agreeable.   Lynnae PrudeELLIOTT, ROBERT, MD  05/21/2016, 12:25 PM

## 2016-05-21 NOTE — Transfer of Care (Signed)
Immediate Anesthesia Transfer of Care Note  Patient: Keith Fuller  Procedure(s) Performed: Procedure(s) with comments: COLONOSCOPY WITH PROPOFOL (N/A) - Gentamicin and Vacomycin IVPB  Patient Location: PACU  Anesthesia Type:General  Level of Consciousness: sedated  Airway & Oxygen Therapy: Patient Spontanous Breathing and Patient connected to nasal cannula oxygen  Post-op Assessment: Report given to RN and Post -op Vital signs reviewed and stable  Post vital signs: Reviewed and stable  Last Vitals:  Vitals:   05/21/16 1011  BP: 133/76  Pulse: 60  Resp: 18  Temp: 36.7 C    Last Pain:  Vitals:   05/21/16 1011  TempSrc: Tympanic         Complications: No apparent anesthesia complications

## 2016-05-21 NOTE — Anesthesia Preprocedure Evaluation (Signed)
Anesthesia Evaluation  Patient identified by MRN, date of birth, ID band Patient awake    Reviewed: Allergy & Precautions, H&P , NPO status , Patient's Chart, lab work & pertinent test results, reviewed documented beta blocker date and time   Airway Mallampati: II   Neck ROM: full    Dental  (+) Poor Dentition, Teeth Intact   Pulmonary neg pulmonary ROS, former smoker,    Pulmonary exam normal        Cardiovascular negative cardio ROS Normal cardiovascular exam     Neuro/Psych  Neuromuscular disease negative neurological ROS  negative psych ROS   GI/Hepatic negative GI ROS, Neg liver ROS, (+) Hepatitis -  Endo/Other  negative endocrine ROS  Renal/GU negative Renal ROS  negative genitourinary   Musculoskeletal   Abdominal   Peds  Hematology negative hematology ROS (+)   Anesthesia Other Findings Past Medical History: No date: Allergy No date: Arthritis No date: Hepatitis C     Comment: denies any virus in system now Past Surgical History: No date: HERNIA REPAIR No date: JOINT REPLACEMENT No date: PROSTATE SURGERY     Comment: biopsy BMI    Body Mass Index:  30.13 kg/m     Reproductive/Obstetrics                             Anesthesia Physical Anesthesia Plan  ASA: III  Anesthesia Plan: General   Post-op Pain Management:    Induction:   Airway Management Planned:   Additional Equipment:   Intra-op Plan:   Post-operative Plan:   Informed Consent: I have reviewed the patients History and Physical, chart, labs and discussed the procedure including the risks, benefits and alternatives for the proposed anesthesia with the patient or authorized representative who has indicated his/her understanding and acceptance.   Dental Advisory Given  Plan Discussed with: CRNA  Anesthesia Plan Comments:         Anesthesia Quick Evaluation

## 2016-05-21 NOTE — Anesthesia Postprocedure Evaluation (Signed)
Anesthesia Post Note  Patient: Lollie MarrowFrederick L Geeslin  Procedure(s) Performed: Procedure(s) (LRB): COLONOSCOPY WITH PROPOFOL (N/A)  Patient location during evaluation: PACU Anesthesia Type: General Level of consciousness: awake and alert Pain management: pain level controlled Vital Signs Assessment: post-procedure vital signs reviewed and stable Respiratory status: spontaneous breathing, nonlabored ventilation, respiratory function stable and patient connected to nasal cannula oxygen Cardiovascular status: blood pressure returned to baseline and stable Postop Assessment: no signs of nausea or vomiting Anesthetic complications: no    Last Vitals:  Vitals:   05/21/16 1218 05/21/16 1228  BP: 122/84 137/71  Pulse: 62 61  Resp: (!) 21 14  Temp:      Last Pain:  Vitals:   05/21/16 1158  TempSrc: Tympanic  PainSc: Asleep                 Yevette EdwardsJames G Josue Falconi

## 2016-05-22 LAB — SURGICAL PATHOLOGY

## 2016-05-23 ENCOUNTER — Encounter: Payer: Self-pay | Admitting: Unknown Physician Specialty

## 2016-06-06 ENCOUNTER — Ambulatory Visit: Payer: BLUE CROSS/BLUE SHIELD | Attending: Pain Medicine | Admitting: Pain Medicine

## 2016-06-06 ENCOUNTER — Encounter: Payer: Self-pay | Admitting: Pain Medicine

## 2016-06-06 VITALS — BP 139/74 | HR 63 | Temp 97.9°F | Resp 18 | Ht 69.0 in | Wt 200.0 lb

## 2016-06-06 DIAGNOSIS — Z5189 Encounter for other specified aftercare: Secondary | ICD-10-CM | POA: Diagnosis present

## 2016-06-06 DIAGNOSIS — M47816 Spondylosis without myelopathy or radiculopathy, lumbar region: Secondary | ICD-10-CM

## 2016-06-06 DIAGNOSIS — Z9889 Other specified postprocedural states: Secondary | ICD-10-CM | POA: Diagnosis not present

## 2016-06-06 DIAGNOSIS — M5416 Radiculopathy, lumbar region: Secondary | ICD-10-CM | POA: Diagnosis not present

## 2016-06-06 DIAGNOSIS — G8929 Other chronic pain: Secondary | ICD-10-CM | POA: Diagnosis not present

## 2016-06-06 DIAGNOSIS — M545 Low back pain: Secondary | ICD-10-CM | POA: Diagnosis not present

## 2016-06-06 DIAGNOSIS — Z87891 Personal history of nicotine dependence: Secondary | ICD-10-CM | POA: Diagnosis not present

## 2016-06-06 DIAGNOSIS — M479 Spondylosis, unspecified: Secondary | ICD-10-CM | POA: Insufficient documentation

## 2016-06-06 DIAGNOSIS — M79605 Pain in left leg: Secondary | ICD-10-CM

## 2016-06-06 DIAGNOSIS — M79604 Pain in right leg: Secondary | ICD-10-CM | POA: Insufficient documentation

## 2016-06-06 NOTE — Patient Instructions (Signed)
Epidural Steroid Injection An epidural steroid injection is a shot of steroid medicine and numbing medicine that is given into the space between the spinal cord and the bones in your back (epidural space). The shot helps relieve pain caused by an irritated or swollen nerve root. The amount of pain relief you get from the injection depends on what is causing the nerve to be swollen and irritated, and how long your pain lasts. You are more likely to benefit from this injection if your pain is strong and comes on suddenly rather than if you have had pain for a long time. Tell a health care provider about:  Any allergies you have.  All medicines you are taking, including vitamins, herbs, eye drops, creams, and over-the-counter medicines.  Any problems you or family members have had with anesthetic medicines.  Any blood disorders you have.  Any surgeries you have had.  Any medical conditions you have.  Whether you are pregnant or may be pregnant. What are the risks? Generally, this is a safe procedure. However, problems may occur, including:  Headache.  Bleeding.  Infection.  Allergic reaction to medicines.  Damage to your nerves. What happens before the procedure? Staying hydrated  Follow instructions from your health care provider about hydration, which may include:  Up to 2 hours before the procedure - you may continue to drink clear liquids, such as water, clear fruit juice, black coffee, and plain tea. Eating and drinking restrictions  Follow instructions from your health care provider about eating and drinking, which may include:  8 hours before the procedure - stop eating heavy meals or foods such as meat, fried foods, or fatty foods.  6 hours before the procedure - stop eating light meals or foods, such as toast or cereal.  6 hours before the procedure - stop drinking milk or drinks that contain milk.  2 hours before the procedure - stop drinking clear  liquids. Medicine  You may be given medicines to lower anxiety.  Ask your health care provider about:  Changing or stopping your regular medicines. This is especially important if you are taking diabetes medicines or blood thinners.  Taking medicines such as aspirin and ibuprofen. These medicines can thin your blood. Do not take these medicines before your procedure if your health care provider instructs you not to. General instructions  Plan to have someone take you home from the hospital or clinic. What happens during the procedure?  You may receive a medicine to help you relax (sedative).  You will be asked to lie on your abdomen.  The injection site will be cleaned.  A numbing medicine (local anesthetic) will be used to numb the injection site.  A needle will be inserted through your skin into the epidural space. You may feel some discomfort when this happens. An X-ray machine will be used to make sure the needle is put as close as possible to the affected nerve.  A steroid medicine and a local anesthetic will be injected into the epidural space.  The needle will be removed.  A bandage (dressing) will be put over the injection site. What happens after the procedure?  Your blood pressure, heart rate, breathing rate, and blood oxygen level will be monitored until the medicines you were given have worn off.  Your arm or leg may feel weak or numb for a few hours.  The injection site may feel sore.  Do not drive for 24 hours if you received a sedative. This information   is not intended to replace advice given to you by your health care provider. Make sure you discuss any questions you have with your health care provider. Document Released: 09/25/2007 Document Revised: 11/30/2015 Document Reviewed: 10/04/2015 [employer] Interactive Patient Education  2017 [employer] GENERAL RISKS AND COMPLICATIONS  What are the risk, side effects and possible complications? Generally  speaking, most procedures are safe.  However, with any procedure there are risks, side effects, and the possibility of complications.  The risks and complications are dependent upon the sites that are lesioned, or the type of nerve block to be performed.  The closer the procedure is to the spine, the more serious the risks are.  Great care is taken when placing the radio frequency needles, block needles or lesioning probes, but sometimes complications can occur. 1. Infection: Any time there is an injection through the skin, there is a risk of infection.  This is why sterile conditions are used for these blocks.  There are four possible types of infection. 1. Localized skin infection. 2. Central Nervous System Infection-This can be in the form of Meningitis, which can be deadly. 3. Epidural Infections-This can be in the form of an epidural abscess, which can cause pressure inside of the spine, causing compression of the spinal cord with subsequent paralysis. This would require an emergency surgery to decompress, and there are no guarantees that the patient would recover from the paralysis. 4. Discitis-This is an infection of the intervertebral discs.  It occurs in about 1% of discography procedures.  It is difficult to treat and it may lead to surgery.        2. Pain: the needles have to go through skin and soft tissues, will cause soreness.       3. Damage to internal structures:  The nerves to be lesioned may be near blood vessels or    other nerves which can be potentially damaged.       4. Bleeding: Bleeding is more common if the patient is taking blood thinners such as  aspirin, Coumadin, Ticiid, Plavix, etc., or if he/she have some genetic predisposition  such as hemophilia. Bleeding into the spinal canal can cause compression of the spinal  cord with subsequent paralysis.  This would require an emergency surgery to  decompress and there are no guarantees that the patient would recover from the   paralysis.       5. Pneumothorax:  Puncturing of a lung is a possibility, every time a needle is introduced in  the area of the chest or upper back.  Pneumothorax refers to free air around the  collapsed lung(s), inside of the thoracic cavity (chest cavity).  Another two possible  complications related to a similar event would include: Hemothorax and Chylothorax.   These are variations of the Pneumothorax, where instead of air around the collapsed  lung(s), you may have blood or chyle, respectively.       6. Spinal headaches: They may occur with any procedures in the area of the spine.       7. Persistent CSF (Cerebro-Spinal Fluid) leakage: This is a rare problem, but may occur  with prolonged intrathecal or epidural catheters either due to the formation of a fistulous  track or a dural tear.       8. Nerve damage: By working so close to the spinal cord, there is always a possibility of  nerve damage, which could be as serious as a permanent spinal cord injury with  paralysis.         9. Death:  Although rare, severe deadly allergic reactions known as "Anaphylactic  reaction" can occur to any of the medications used.      10. Worsening of the symptoms:  We can always make thing worse.  What are the chances of something like this happening? Chances of any of this occuring are extremely low.  By statistics, you have more of a chance of getting killed in a motor vehicle accident: while driving to the hospital than any of the above occurring .  Nevertheless, you should be aware that they are possibilities.  In general, it is similar to taking a shower.  Everybody knows that you can slip, hit your head and get killed.  Does that mean that you should not shower again?  Nevertheless always keep in mind that statistics do not mean anything if you happen to be on the wrong side of them.  Even if a procedure has a 1 (one) in a 1,000,000 (million) chance of going wrong, it you happen to be that one..Also, keep in mind  that by statistics, you have more of a chance of having something go wrong when taking medications.  Who should not have this procedure? If you are on a blood thinning medication (e.g. Coumadin, Plavix, see list of "Blood Thinners"), or if you have an active infection going on, you should not have the procedure.  If you are taking any blood thinners, please inform your physician.  How should I prepare for this procedure?  Do not eat or drink anything at least six hours prior to the procedure.  Bring a driver with you .  It cannot be a taxi.  Come accompanied by an adult that can drive you back, and that is strong enough to help you if your legs get weak or numb from the local anesthetic.  Take all of your medicines the morning of the procedure with just enough water to swallow them.  If you have diabetes, make sure that you are scheduled to have your procedure done first thing in the morning, whenever possible.  If you have diabetes, take only half of your insulin dose and notify our nurse that you have done so as soon as you arrive at the clinic.  If you are diabetic, but only take blood sugar pills (oral hypoglycemic), then do not take them on the morning of your procedure.  You may take them after you have had the procedure.  Do not take aspirin or any aspirin-containing medications, at least eleven (11) days prior to the procedure.  They may prolong bleeding.  Wear loose fitting clothing that may be easy to take off and that you would not mind if it got stained with Betadine or blood.  Do not wear any jewelry or perfume  Remove any nail coloring.  It will interfere with some of our monitoring equipment.  NOTE: Remember that this is not meant to be interpreted as a complete list of all possible complications.  Unforeseen problems may occur.  BLOOD THINNERS The following drugs contain aspirin or other products, which can cause increased bleeding during surgery and should not be  taken for 2 weeks prior to and 1 week after surgery.  If you should need take something for relief of minor pain, you may take acetaminophen which is found in Tylenol,m Datril, Anacin-3 and Panadol. It is not blood thinner. The products listed below are.  Do not take any of the products listed below in addition to any listed on   your instruction sheet.  A.P.C or A.P.C with Codeine Codeine Phosphate Capsules #3 Ibuprofen Ridaura  ABC compound Congesprin Imuran rimadil  Advil Cope Indocin Robaxisal  Alka-Seltzer Effervescent Pain Reliever and Antacid Coricidin or Coricidin-D  Indomethacin Rufen  Alka-Seltzer plus Cold Medicine Cosprin Ketoprofen S-A-C Tablets  Anacin Analgesic Tablets or Capsules Coumadin Korlgesic Salflex  Anacin Extra Strength Analgesic tablets or capsules CP-2 Tablets Lanoril Salicylate  Anaprox Cuprimine Capsules Levenox Salocol  Anexsia-D Dalteparin Magan Salsalate  Anodynos Darvon compound Magnesium Salicylate Sine-off  Ansaid Dasin Capsules Magsal Sodium Salicylate  Anturane Depen Capsules Marnal Soma  APF Arthritis pain formula Dewitt's Pills Measurin Stanback  Argesic Dia-Gesic Meclofenamic Sulfinpyrazone  Arthritis Bayer Timed Release Aspirin Diclofenac Meclomen Sulindac  Arthritis pain formula Anacin Dicumarol Medipren Supac  Analgesic (Safety coated) Arthralgen Diffunasal Mefanamic Suprofen  Arthritis Strength Bufferin Dihydrocodeine Mepro Compound Suprol  Arthropan liquid Dopirydamole Methcarbomol with Aspirin Synalgos  ASA tablets/Enseals Disalcid Micrainin Tagament  Ascriptin Doan's Midol Talwin  Ascriptin A/D Dolene Mobidin Tanderil  Ascriptin Extra Strength Dolobid Moblgesic Ticlid  Ascriptin with Codeine Doloprin or Doloprin with Codeine Momentum Tolectin  Asperbuf Duoprin Mono-gesic Trendar  Aspergum Duradyne Motrin or Motrin IB Triminicin  Aspirin plain, buffered or enteric coated Durasal Myochrisine Trigesic  Aspirin Suppositories Easprin Nalfon  Trillsate  Aspirin with Codeine Ecotrin Regular or Extra Strength Naprosyn Uracel  Atromid-S Efficin Naproxen Ursinus  Auranofin Capsules Elmiron Neocylate Vanquish  Axotal Emagrin Norgesic Verin  Azathioprine Empirin or Empirin with Codeine Normiflo Vitamin E  Azolid Emprazil Nuprin Voltaren  Bayer Aspirin plain, buffered or children's or timed BC Tablets or powders Encaprin Orgaran Warfarin Sodium  Buff-a-Comp Enoxaparin Orudis Zorpin  Buff-a-Comp with Codeine Equegesic Os-Cal-Gesic   Buffaprin Excedrin plain, buffered or Extra Strength Oxalid   Bufferin Arthritis Strength Feldene Oxphenbutazone   Bufferin plain or Extra Strength Feldene Capsules Oxycodone with Aspirin   Bufferin with Codeine Fenoprofen Fenoprofen Pabalate or Pabalate-SF   Buffets II Flogesic Panagesic   Buffinol plain or Extra Strength Florinal or Florinal with Codeine Panwarfarin   Buf-Tabs Flurbiprofen Penicillamine   Butalbital Compound Four-way cold tablets Penicillin   Butazolidin Fragmin Pepto-Bismol   Carbenicillin Geminisyn Percodan   Carna Arthritis Reliever Geopen Persantine   Carprofen Gold's salt Persistin   Chloramphenicol Goody's Phenylbutazone   Chloromycetin Haltrain Piroxlcam   Clmetidine heparin Plaquenil   Cllnoril Hyco-pap Ponstel   Clofibrate Hydroxy chloroquine Propoxyphen         Before stopping any of these medications, be sure to consult the physician who ordered them.  Some, such as Coumadin (Warfarin) are ordered to prevent or treat serious conditions such as "deep thrombosis", "pumonary embolisms", and other heart problems.  The amount of time that you may need off of the medication may also vary with the medication and the reason for which you were taking it.  If you are taking any of these medications, please make sure you notify your pain physician before you undergo any procedures.          

## 2016-06-06 NOTE — Progress Notes (Signed)
Safety precautions to be maintained throughout the outpatient stay will include: orient to surroundings, keep bed in low position, maintain call bell within reach at all times, provide assistance with transfer out of bed and ambulation.  

## 2016-06-06 NOTE — Progress Notes (Signed)
Patient's Name: Keith Fuller  MRN: [id number]  Referring Provider: Lynnea Ferrier, MD  DOB: May 18, 1951  PCP: Lynnea Ferrier, MD  DOS: 06/06/2016  Note by: Sydnee Levans. Laban Emperor, MD  Service setting: Ambulatory outpatient  Specialty: Interventional Pain Management  Location: [facility] (AMB) Pain Management Facility    Patient type: Established   Primary Reason(s) for Visit: Encounter for post-procedure evaluation of chronic illness with mild to moderate exacerbation CC: Back Pain (lower left buttocks)  HPI  Keith Fuller is a 65 y.o. year old, male patient, who comes today for a post-procedure evaluation. He has Benign prostatic hyperplasia with urinary obstruction; Elevated prostate specific antigen (PSA); Lumbar spondylosis; Chronic low back pain (Location of Primary Source of Pain) (Bilateral) (L>R); Lumbar facet syndrome (Bilateral) (L>R); Chronic pain; Musculoskeletal pain; Chronic lower extremity pain (Location of Secondary source of pain) (Referred Pain) (Bilateral) (L>R); Plantar fasciitis (Bilateral); Abnormal CT scan, lumbar spine; Abnormal x-ray of lumbar spine; Grade 1 Retrolisthesis of L2 over L3; Lumbar central spinal stenosis (L2-3, L3-4); Lumbar lateral recess stenosis (Bilateral L3-4; Left L4-5); 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); Generalized osteoarthritis of hand; Constipation, unspecified; Urinary dysfunction; Radiculopathy of sacral region; Testicular pain, unspecified; and Pain with bowel  movements on his problem list. His primarily concern today is the Back Pain (lower left buttocks)  Pain Assessment: Self-Reported Pain Score: 1 /10             Reported level is compatible with observation.       Pain Type: Chronic pain Pain Location: Buttocks Pain Orientation: Lower, Left Pain Descriptors / Indicators: Aching, Dull Pain Frequency: Constant  [name] comes in today for post-procedure evaluation after the treatment done on 05/01/2016.  Further details on both, my assessment(s), as well as the proposed treatment plan, please see below.  Post-Procedure Assessment  05/01/2016 Procedure: Diagnostic left sided caudal epidural steroid injection under fluoroscopic guidance and IV sedation. Post-procedure pain score: 0/10 (100% relief) Influential Factors: BMI: 29.53 kg/m Intra-procedural challenges: None observed Assessment challenges: None detected         Post-procedural side-effects, adverse reactions, or complications: None reported Reported issues: None  Sedation: Sedation provided. When no sedatives are used, the analgesic levels obtained are directly associated to the effectiveness of the local anesthetics. However, when sedation is provided, the level of analgesia obtained during the initial 1 hour following the intervention, is believed to be the result of a combination of factors. These factors may include, but are not limited to: 1. The effectiveness of the local anesthetics used. 2. The effects of the analgesic(s) and/or anxiolytic(s) used. 3. The degree of discomfort experienced by the patient at the time of the procedure. 4. The patients ability and reliability in recalling and recording the events. 5. The presence and influence of possible secondary gains and/or psychosocial factors. Reported result: Relief experienced during the 1st hour after the procedure: 50 % (Ultra-Short Term Relief) Interpretative annotation: Analgesia during this period is likely  to be Local Anesthetic and/or IV Sedative (Analgesic/Anxiolitic) related.          Effects of local anesthetic: The analgesic  effects attained during this period are directly associated to the localized infiltration of local anesthetics and therefore cary significant diagnostic value as to the etiological location, or anatomical origin, of the pain. Expected duration of relief is directly dependent on the pharmacodynamics of the local anesthetic used. Long-acting (4-6 hours) anesthetics used.  Reported result: Relief during the next 4 to 6 hour after the procedure: 50 % (Short-Term Relief) Interpretative annotation: Complete relief would suggest area to be the source of the pain.          Long-term benefit: Defined as the period of time past the expected duration of local anesthetics. With the possible exception of prolonged sympathetic blockade from the local anesthetics, benefits during this period are typically attributed to, or associated with, other factors such as analgesic sensory neuropraxia, antiinflammatory effects, or beneficial biochemical changes provided by agents other than the local anesthetics Reported result: Extended relief following procedure: 75 % (Long-Term Relief) Interpretative annotation: Good relief. This could suggest inflammation to be a significant component in the etiology to the pain.          Current benefits: Defined as persistent relief that continues at this point in time.   Reported results: Treated area: 75 % In addition, the patient reports improvement in function Interpretative annotation: Ongoing benefits would suggest effective therapeutic approach  Interpretation: Results would suggest that repeating the procedure may be necessary, for therapeutic reasons  Laboratory Chemistry  Inflammation Markers No results found for: ESRSEDRATE, CRP Renal Function Lab Results  Component Value Date   BUN 17 02/26/2014   CREATININE 1.24 02/26/2014   GFRAA >60  02/26/2014   GFRNONAA >60 02/26/2014   Hepatic Function No results found for: AST, ALT, ALBUMIN Electrolytes Lab Results  Component Value Date   NA 135 (L) 02/26/2014   K 4.1 02/26/2014   CL 102 02/26/2014   CALCIUM 7.9 (L) 02/26/2014   Pain Modulating Vitamins No results found for: Danae OrleansVD25OH, VD125OH2TOT, [id number], [id number], 25OHVITD1, 25OHVITD2, 25OHVITD3, VITAMINB12 Coagulation Parameters Lab Results  Component Value Date   INR 0.9 02/09/2014   LABPROT 12.4 02/09/2014   APTT 27.1 02/09/2014   PLT 115 (L) 02/26/2014   Cardiovascular Lab Results  Component Value Date   HGB 12.0 (L) 02/27/2014   HCT 38.3 (L) 02/26/2014   Note: Lab results reviewed.  Recent Diagnostic Imaging Review  Dg C-arm 1-60 Min-no Report  Result Date: 05/01/2016 CLINICAL DATA: Assistance in needle guidance and placement for procedures requiring needle placement in or near specific anatomical locations not easily accessible without such assistance. C-ARM 1-60 MINUTES Fluoroscopy was utilized by the requesting physician.  No radiographic interpretation.   Note: Imaging results reviewed.          Meds  The patient has a current medication list which includes the following prescription(s): cyclobenzaprine, finasteride, fish oil, omeprazole, and tamsulosin.  Current Outpatient Prescriptions on File Prior to Visit  Medication Sig  . cyclobenzaprine (FLEXERIL) 10 MG tablet Take 1 tablet (10 mg total) by mouth 3 (three) times daily as needed for muscle spasms.  Marland Kitchen. FINASTERIDE PO Take 5 mg by mouth every morning.  . Omega-3 Fatty Acids (FISH OIL) 1000 MG CPDR Take 1,000 mg by mouth every morning.  Marland Kitchen. omeprazole (PRILOSEC) 20 MG capsule Take 20 mg by mouth every morning.   No current facility-administered medications on file prior to visit.    ROS  Constitutional: Denies any fever or chills Gastrointestinal: No reported hemesis, hematochezia, vomiting, or acute GI distress Musculoskeletal: Denies any  acute onset joint swelling, redness, loss of ROM, or weakness Neurological: No reported episodes of acute onset apraxia, aphasia, dysarthria, agnosia, amnesia, paralysis, loss of coordination, or loss of consciousness  Allergies  Mr. Sermon is allergic to penicillins.  PFSH  Drug: Mr. Lundberg  reports that he does not use drugs. Alcohol:  reports that he drinks alcohol. Tobacco:  reports that he has quit smoking. He has never used smokeless tobacco. Medical:  has a past medical history of Allergy; Arthritis; and Hepatitis C. Family: family history includes Arthritis in his mother; Cancer in his father; Stroke in his father.  Past Surgical History:  Procedure Laterality Date  . COLONOSCOPY WITH PROPOFOL N/A 05/21/2016   Procedure: COLONOSCOPY WITH PROPOFOL;  Surgeon: Scot Jun, MD;  Location: [facility] ENDOSCOPY;  Service: Endoscopy;  Laterality: N/A;  Gentamicin and Vacomycin IVPB  . HERNIA REPAIR    . JOINT REPLACEMENT    . PROSTATE SURGERY     biopsy   Constitutional Exam  General appearance: Well nourished, well developed, and well hydrated. In no apparent acute distress Vitals:   06/06/16 1328  BP: 139/74  Pulse: 63  Resp: 18  Temp: 97.9 F (36.6 C)  SpO2: 100%  Weight: 200 lb (90.7 kg)  Height: 5\' 9"  (1.753 m)   BMI Assessment: Estimated body mass index is 29.53 kg/m as calculated from the following:   Height as of this encounter: 5\' 9"  (1.753 m).   Weight as of this encounter: 200 lb (90.7 kg).  BMI interpretation table: BMI level Category Range association with higher incidence of chronic pain  <18 kg/m2 Underweight   18.5-24.9 kg/m2 Ideal body weight   25-29.9 kg/m2 Overweight Increased incidence by 20%  30-34.9 kg/m2 Obese (Class I) Increased incidence by 68%  35-39.9 kg/m2 Severe obesity (Class II) Increased incidence by 136%  >40 kg/m2 Extreme obesity (Class III) Increased incidence by 254%   BMI Readings from Last 4 Encounters:  06/06/16 29.53 kg/m    05/21/16 30.13 kg/m  05/01/16 30.13 kg/m  12/20/15 29.65 kg/m   Wt Readings from Last 4 Encounters:  06/06/16 200 lb (90.7 kg)  05/21/16 204 lb (92.5 kg)  05/01/16 204 lb (92.5 kg)  12/20/15 195 lb (88.5 kg)  Psych/Mental status: Alert, oriented x 3 (person, place, & time) Eyes: PERLA Respiratory: No evidence of acute respiratory distress  Cervical Spine Exam  Inspection: No masses, redness, or swelling Alignment: Symmetrical Functional ROM: Unrestricted ROM Stability: No instability detected Muscle strength & Tone: Functionally intact Sensory: Unimpaired Palpation: Non-contributory  Upper Extremity (UE) Exam    Side: Right upper extremity  Side: Left upper extremity  Inspection: No masses, redness, swelling, or asymmetry  Inspection: No masses, redness, swelling, or asymmetry  Functional ROM: Unrestricted ROM          Functional ROM: Unrestricted ROM          Muscle strength & Tone: Functionally intact  Muscle strength & Tone: Functionally intact  Sensory: Unimpaired  Sensory: Unimpaired  Palpation: Non-contributory  Palpation: Non-contributory   Thoracic Spine Exam  Inspection: No masses, redness, or swelling Alignment: Symmetrical Functional ROM: Unrestricted ROM Stability: No instability detected Sensory: Unimpaired Muscle strength & Tone: Functionally intact Palpation: Non-contributory  Lumbar Spine Exam  Inspection: No masses, redness, or swelling Alignment: Symmetrical Functional ROM: Unrestricted ROM Stability: No instability detected Muscle strength & Tone: Functionally intact Sensory: Unimpaired Palpation: Non-contributory Provocative Tests: Lumbar Hyperextension and rotation test: evaluation deferred today       Patrick's Maneuver:  evaluation deferred today              Gait & Posture Assessment  Ambulation: Unassisted Gait: Relatively normal for age and body habitus Posture: WNL   Lower Extremity Exam    Side: Right lower extremity  Side: Left  lower extremity  Inspection: No masses, redness, swelling, or asymmetry  Inspection: No masses, redness, swelling, or asymmetry  Functional ROM: Unrestricted ROM          Functional ROM: Unrestricted ROM          Muscle strength & Tone: Functionally intact  Muscle strength & Tone: Functionally intact  Sensory: Unimpaired  Sensory: Unimpaired  Palpation: Non-contributory  Palpation: Non-contributory   Assessment  Primary Diagnosis & Pertinent Problem List: The primary encounter diagnosis was Chronic lumbar radicular pain (Location of Secondary source of pain) (Bilateral) (L>R). Diagnoses of Lumbar spondylosis and Chronic pain of both lower extremities were also pertinent to this visit.  Visit Diagnosis: 1. Chronic lumbar radicular pain (Location of Secondary source of pain) (Bilateral) (L>R)   2. Lumbar spondylosis   3. Chronic pain of both lower extremities    Plan of Care  Pharmacotherapy (Medications Ordered): No orders of the defined types were placed in this encounter.  New Prescriptions   No medications on file   Medications administered today: Mr. Naugle had no medications administered during this visit. Lab-work, procedure(s), and/or referral(s): Orders Placed This Encounter  Procedures  . Caudal Epidural Injection  . Caudal Epidural Injection   Imaging and/or referral(s): None  Interventional therapies: Planned, scheduled, and/or pending:   Palliative left caudal epidural steroid injection under fluoroscopic guidance and IV sedation.    Considering:   Palliative left caudal epidural steroid injection under fluoroscopic guidance and IV sedation.    Palliative PRN treatment(s):   Not at this time.   Provider-requested follow-up: Return for procedure (ASAP), in addition, (PRN) procedure.  Future Appointments Date Time Provider Department Center  06/18/2016 9:00 AM Delano Metz, MD [facility] None   Primary Care Physician: Lynnea Ferrier, MD Location:  [facility] Outpatient Pain Management Facility Note by: Sydnee Levans Laban Emperor, M.D, DABA, DABAPM, DABPM, DABIPP, FIPP Date: 06/06/16; Time: 2:21 PM  Pain Score Disclaimer: We use the NRS-11 scale. This is a self-reported, subjective measurement of pain severity with only modest accuracy. It is used primarily to identify changes within a particular patient. It must be understood that outpatient pain scales are significantly less accurate that those used for research, where they can be applied under ideal controlled circumstances with minimal exposure to variables. In reality, the score is likely to be a combination of pain intensity and pain affect, where pain affect describes the degree of emotional arousal or changes in action readiness caused by the sensory experience of pain. Factors such as social and work situation, setting, emotional state, anxiety levels, expectation, and prior pain experience may influence pain perception and show large inter-individual differences that may also be affected by time variables.  Patient instructions provided during this appointment: Patient Instructions   Epidural Steroid Injection An epidural steroid injection is a shot of steroid medicine and numbing medicine that is given into the space between the spinal cord and the bones in your back (epidural space). The shot helps relieve pain caused by an irritated or swollen nerve root. The amount of pain relief you get from the injection depends on what is causing the nerve to be swollen and irritated, and how long your pain lasts. You are more  likely to benefit from this injection if your pain is strong and comes on suddenly rather than if you have had pain for a long time. Tell a health care provider about:  Any allergies you have.  All medicines you are taking, including vitamins, herbs, eye drops, creams, and over-the-counter medicines.  Any problems you or family members have had with anesthetic medicines.  Any  blood disorders you have.  Any surgeries you have had.  Any medical conditions you have.  Whether you are pregnant or may be pregnant. What are the risks? Generally, this is a safe procedure. However, problems may occur, including:  Headache.  Bleeding.  Infection.  Allergic reaction to medicines.  Damage to your nerves. What happens before the procedure? Staying hydrated  Follow instructions from your health care provider about hydration, which may include:  Up to 2 hours before the procedure - you may continue to drink clear liquids, such as water, clear fruit juice, black coffee, and plain tea. Eating and drinking restrictions  Follow instructions from your health care provider about eating and drinking, which may include:  8 hours before the procedure - stop eating heavy meals or foods such as meat, fried foods, or fatty foods.  6 hours before the procedure - stop eating light meals or foods, such as toast or cereal.  6 hours before the procedure - stop drinking milk or drinks that contain milk.  2 hours before the procedure - stop drinking clear liquids. Medicine  You may be given medicines to lower anxiety.  Ask your health care provider about:  Changing or stopping your regular medicines. This is especially important if you are taking diabetes medicines or blood thinners.  Taking medicines such as aspirin and ibuprofen. These medicines can thin your blood. Do not take these medicines before your procedure if your health care provider instructs you not to. General instructions  Plan to have someone take you home from the hospital or clinic. What happens during the procedure?  You may receive a medicine to help you relax (sedative).  You will be asked to lie on your abdomen.  The injection site will be cleaned.  A numbing medicine (local anesthetic) will be used to numb the injection site.  A needle will be inserted through your skin into the epidural  space. You may feel some discomfort when this happens. An X-ray machine will be used to make sure the needle is put as close as possible to the affected nerve.  A steroid medicine and a local anesthetic will be injected into the epidural space.  The needle will be removed.  A bandage (dressing) will be put over the injection site. What happens after the procedure?  Your blood pressure, heart rate, breathing rate, and blood oxygen level will be monitored until the medicines you were given have worn off.  Your arm or leg may feel weak or numb for a few hours.  The injection site may feel sore.  Do not drive for 24 hours if you received a sedative. This information is not intended to replace advice given to you by your health care provider. Make sure you discuss any questions you have with your health care provider. Document Released: 09/25/2007 Document Revised: 11/30/2015 Document Reviewed: 10/04/2015 Elsevier Interactive Patient Education  2017 [employer] GENERAL RISKS AND COMPLICATIONS  What are the risk, side effects and possible complications? Generally speaking, most procedures are safe.  However, with any procedure there are risks, side effects, and the possibility  of complications.  The risks and complications are dependent upon the sites that are lesioned, or the type of nerve block to be performed.  The closer the procedure is to the spine, the more serious the risks are.  Great care is taken when placing the radio frequency needles, block needles or lesioning probes, but sometimes complications can occur. 1. Infection: Any time there is an injection through the skin, there is a risk of infection.  This is why sterile conditions are used for these blocks.  There are four possible types of infection. 1. Localized skin infection. 2. Central Nervous System Infection-This can be in the form of Meningitis, which can be deadly. 3. Epidural Infections-This can be in the form of an  epidural abscess, which can cause pressure inside of the spine, causing compression of the spinal cord with subsequent paralysis. This would require an emergency surgery to decompress, and there are no guarantees that the patient would recover from the paralysis. 4. Discitis-This is an infection of the intervertebral discs.  It occurs in about 1% of discography procedures.  It is difficult to treat and it may lead to surgery.        2. Pain: the needles have to go through skin and soft tissues, will cause soreness.       3. Damage to internal structures:  The nerves to be lesioned may be near blood vessels or    other nerves which can be potentially damaged.       4. Bleeding: Bleeding is more common if the patient is taking blood thinners such as  aspirin, Coumadin, Ticiid, Plavix, etc., or if he/she have some genetic predisposition  such as hemophilia. Bleeding into the spinal canal can cause compression of the spinal  cord with subsequent paralysis.  This would require an emergency surgery to  decompress and there are no guarantees that the patient would recover from the  paralysis.       5. Pneumothorax:  Puncturing of a lung is a possibility, every time a needle is introduced in  the area of the chest or upper back.  Pneumothorax refers to free air around the  collapsed lung(s), inside of the thoracic cavity (chest cavity).  Another two possible  complications related to a similar event would include: Hemothorax and Chylothorax.   These are variations of the Pneumothorax, where instead of air around the collapsed  lung(s), you may have blood or chyle, respectively.       6. Spinal headaches: They may occur with any procedures in the area of the spine.       7. Persistent CSF (Cerebro-Spinal Fluid) leakage: This is a rare problem, but may occur  with prolonged intrathecal or epidural catheters either due to the formation of a fistulous  track or a dural tear.       8. Nerve damage: By working so close  to the spinal cord, there is always a possibility of  nerve damage, which could be as serious as a permanent spinal cord injury with  paralysis.       9. Death:  Although rare, severe deadly allergic reactions known as "Anaphylactic  reaction" can occur to any of the medications used.      10. Worsening of the symptoms:  We can always make thing worse.  What are the chances of something like this happening? Chances of any of this occuring are extremely low.  By statistics, you have more of a chance of getting killed in  a motor vehicle accident: while driving to the hospital than any of the above occurring .  Nevertheless, you should be aware that they are possibilities.  In general, it is similar to taking a shower.  Everybody knows that you can slip, hit your head and get killed.  Does that mean that you should not shower again?  Nevertheless always keep in mind that statistics do not mean anything if you happen to be on the wrong side of them.  Even if a procedure has a 1 (one) in a 1,000,000 (million) chance of going wrong, it you happen to be that one..Also, keep in mind that by statistics, you have more of a chance of having something go wrong when taking medications.  Who should not have this procedure? If you are on a blood thinning medication (e.g. Coumadin, Plavix, see list of "Blood Thinners"), or if you have an active infection going on, you should not have the procedure.  If you are taking any blood thinners, please inform your physician.  How should I prepare for this procedure?  Do not eat or drink anything at least six hours prior to the procedure.  Bring a driver with you .  It cannot be a taxi.  Come accompanied by an adult that can drive you back, and that is strong enough to help you if your legs get weak or numb from the local anesthetic.  Take all of your medicines the morning of the procedure with just enough water to swallow them.  If you have diabetes, make sure that you  are scheduled to have your procedure done first thing in the morning, whenever possible.  If you have diabetes, take only half of your insulin dose and notify our nurse that you have done so as soon as you arrive at the clinic.  If you are diabetic, but only take blood sugar pills (oral hypoglycemic), then do not take them on the morning of your procedure.  You may take them after you have had the procedure.  Do not take aspirin or any aspirin-containing medications, at least eleven (11) days prior to the procedure.  They may prolong bleeding.  Wear loose fitting clothing that may be easy to take off and that you would not mind if it got stained with Betadine or blood.  Do not wear any jewelry or perfume  Remove any nail coloring.  It will interfere with some of our monitoring equipment.  NOTE: Remember that this is not meant to be interpreted as a complete list of all possible complications.  Unforeseen problems may occur.  BLOOD THINNERS The following drugs contain aspirin or other products, which can cause increased bleeding during surgery and should not be taken for 2 weeks prior to and 1 week after surgery.  If you should need take something for relief of minor pain, you may take acetaminophen which is found in Tylenol,m Datril, Anacin-3 and Panadol. It is not blood thinner. The products listed below are.  Do not take any of the products listed below in addition to any listed on your instruction sheet.  A.P.C or A.P.C with Codeine Codeine Phosphate Capsules #3 Ibuprofen Ridaura  ABC compound Congesprin Imuran rimadil  Advil Cope Indocin Robaxisal  Alka-Seltzer Effervescent Pain Reliever and Antacid Coricidin or Coricidin-D  Indomethacin Rufen  Alka-Seltzer plus Cold Medicine Cosprin Ketoprofen S-A-C Tablets  Anacin Analgesic Tablets or Capsules Coumadin Korlgesic Salflex  Anacin Extra Strength Analgesic tablets or capsules CP-2 Tablets Lanoril Salicylate  Anaprox Cuprimine  Capsules  Levenox Salocol  Anexsia-D Dalteparin Magan Salsalate  Anodynos Darvon compound Magnesium Salicylate Sine-off  Ansaid Dasin Capsules Magsal Sodium Salicylate  Anturane Depen Capsules Marnal Soma  APF Arthritis pain formula Dewitt's Pills Measurin Stanback  Argesic Dia-Gesic Meclofenamic Sulfinpyrazone  Arthritis Bayer Timed Release Aspirin Diclofenac Meclomen Sulindac  Arthritis pain formula Anacin Dicumarol Medipren Supac  Analgesic (Safety coated) Arthralgen Diffunasal Mefanamic Suprofen  Arthritis Strength Bufferin Dihydrocodeine Mepro Compound Suprol  Arthropan liquid Dopirydamole Methcarbomol with Aspirin Synalgos  ASA tablets/Enseals Disalcid Micrainin Tagament  Ascriptin Doan's Midol Talwin  Ascriptin A/D Dolene Mobidin Tanderil  Ascriptin Extra Strength Dolobid Moblgesic Ticlid  Ascriptin with Codeine Doloprin or Doloprin with Codeine Momentum Tolectin  Asperbuf Duoprin Mono-gesic Trendar  Aspergum Duradyne Motrin or Motrin IB Triminicin  Aspirin plain, buffered or enteric coated Durasal Myochrisine Trigesic  Aspirin Suppositories Easprin Nalfon Trillsate  Aspirin with Codeine Ecotrin Regular or Extra Strength Naprosyn Uracel  Atromid-S Efficin Naproxen Ursinus  Auranofin Capsules Elmiron Neocylate Vanquish  Axotal Emagrin Norgesic Verin  Azathioprine Empirin or Empirin with Codeine Normiflo Vitamin E  Azolid Emprazil Nuprin Voltaren  Bayer Aspirin plain, buffered or children's or timed BC Tablets or powders Encaprin Orgaran Warfarin Sodium  Buff-a-Comp Enoxaparin Orudis Zorpin  Buff-a-Comp with Codeine Equegesic Os-Cal-Gesic   Buffaprin Excedrin plain, buffered or Extra Strength Oxalid   Bufferin Arthritis Strength Feldene Oxphenbutazone   Bufferin plain or Extra Strength Feldene Capsules Oxycodone with Aspirin   Bufferin with Codeine Fenoprofen Fenoprofen Pabalate or Pabalate-SF   Buffets II Flogesic Panagesic   Buffinol plain or Extra Strength Florinal or Florinal with  Codeine Panwarfarin   Buf-Tabs Flurbiprofen Penicillamine   Butalbital Compound Four-way cold tablets Penicillin   Butazolidin Fragmin Pepto-Bismol   Carbenicillin Geminisyn Percodan   Carna Arthritis Reliever Geopen Persantine   Carprofen Gold's salt Persistin   Chloramphenicol Goody's Phenylbutazone   Chloromycetin Haltrain Piroxlcam   Clmetidine heparin Plaquenil   Cllnoril Hyco-pap Ponstel   Clofibrate Hydroxy chloroquine Propoxyphen         Before stopping any of these medications, be sure to consult the physician who ordered them.  Some, such as Coumadin (Warfarin) are ordered to prevent or treat serious conditions such as "deep thrombosis", "pumonary embolisms", and other heart problems.  The amount of time that you may need off of the medication may also vary with the medication and the reason for which you were taking it.  If you are taking any of these medications, please make sure you notify your pain physician before you undergo any procedures.

## 2016-06-18 ENCOUNTER — Ambulatory Visit: Payer: BLUE CROSS/BLUE SHIELD | Admitting: Pain Medicine

## 2016-06-18 ENCOUNTER — Encounter: Payer: Self-pay | Admitting: Pain Medicine

## 2016-06-18 ENCOUNTER — Ambulatory Visit
Admission: RE | Admit: 2016-06-18 | Discharge: 2016-06-18 | Disposition: A | Discharge status: Home / Self Care | Payer: BLUE CROSS/BLUE SHIELD | Source: Ambulatory Visit | Attending: Pain Medicine | Admitting: Pain Medicine

## 2016-06-18 VITALS — BP 112/90 | HR 56 | Temp 96.8°F | Resp 16 | Ht 69.0 in | Wt 200.0 lb

## 2016-06-18 DIAGNOSIS — G8929 Other chronic pain: Secondary | ICD-10-CM | POA: Insufficient documentation

## 2016-06-18 DIAGNOSIS — M5441 Lumbago with sciatica, right side: Secondary | ICD-10-CM | POA: Diagnosis present

## 2016-06-18 DIAGNOSIS — M479 Spondylosis, unspecified: Secondary | ICD-10-CM | POA: Insufficient documentation

## 2016-06-18 DIAGNOSIS — M5418 Radiculopathy, sacral and sacrococcygeal region: Secondary | ICD-10-CM

## 2016-06-18 DIAGNOSIS — M48061 Spinal stenosis, lumbar region without neurogenic claudication: Secondary | ICD-10-CM | POA: Insufficient documentation

## 2016-06-18 DIAGNOSIS — M47816 Spondylosis without myelopathy or radiculopathy, lumbar region: Secondary | ICD-10-CM

## 2016-06-18 DIAGNOSIS — M5442 Lumbago with sciatica, left side: Principal | ICD-10-CM

## 2016-06-18 DIAGNOSIS — M5416 Radiculopathy, lumbar region: Secondary | ICD-10-CM

## 2016-06-18 DIAGNOSIS — M79604 Pain in right leg: Secondary | ICD-10-CM

## 2016-06-18 DIAGNOSIS — G894 Chronic pain syndrome: Secondary | ICD-10-CM

## 2016-06-18 DIAGNOSIS — M79605 Pain in left leg: Secondary | ICD-10-CM

## 2016-06-18 MED ORDER — METHYLPREDNISOLONE ACETATE 80 MG/ML IJ SUSP
INTRAMUSCULAR | Status: AC
  Administered 2016-06-18: 11:00:00
  Filled 2016-06-18: qty 1

## 2016-06-18 MED ORDER — TRIAMCINOLONE ACETONIDE 40 MG/ML IJ SUSP
40.0000 mg | Freq: Once | INTRAMUSCULAR | Status: DC
Start: 2016-06-18 — End: 2018-04-08

## 2016-06-18 MED ORDER — ROPIVACAINE HCL 2 MG/ML IJ SOLN
INTRAMUSCULAR | Status: AC
  Administered 2016-06-18: 10:00:00
  Filled 2016-06-18: qty 10

## 2016-06-18 MED ORDER — MIDAZOLAM HCL 5 MG/5ML IJ SOLN: 1.0000 mg | INTRAMUSCULAR | Status: DC | PRN

## 2016-06-18 MED ORDER — TRIAMCINOLONE ACETONIDE 40 MG/ML IJ SUSP
INTRAMUSCULAR | Status: AC
  Administered 2016-06-18: 10:00:00
  Filled 2016-06-18: qty 1

## 2016-06-18 MED ORDER — SODIUM CHLORIDE 0.9 % IJ SOLN
INTRAMUSCULAR | Status: AC
  Administered 2016-06-18: 10:00:00
  Filled 2016-06-18: qty 20

## 2016-06-18 MED ORDER — MIDAZOLAM HCL 5 MG/5ML IJ SOLN
INTRAMUSCULAR | Status: AC
  Administered 2016-06-18: 5 mg
  Filled 2016-06-18: qty 5

## 2016-06-18 MED ORDER — LACTATED RINGERS IV SOLN: 1000.0000 mL | Freq: Once | INTRAVENOUS | Status: DC

## 2016-06-18 MED ORDER — SODIUM CHLORIDE 0.9% FLUSH: 2.0000 mL | Freq: Once | INTRAVENOUS | Status: DC

## 2016-06-18 MED ORDER — ROPIVACAINE HCL 2 MG/ML IJ SOLN: 2.0000 mL | Freq: Once | INTRAMUSCULAR | Status: DC

## 2016-06-18 MED ORDER — IOPAMIDOL (ISOVUE-M 200) INJECTION 41%
10.0000 mL | Freq: Once | INTRAMUSCULAR | Status: DC
  Filled 2016-06-18: qty 10

## 2016-06-18 MED ORDER — LIDOCAINE HCL (PF) 1 % IJ SOLN
INTRAMUSCULAR | Status: AC
  Administered 2016-06-18: 10:00:00
  Filled 2016-06-18: qty 5

## 2016-06-18 MED ORDER — FENTANYL CITRATE (PF) 100 MCG/2ML IJ SOLN
INTRAMUSCULAR | Status: AC
  Administered 2016-06-18: 100 ug
  Filled 2016-06-18: qty 2

## 2016-06-18 MED ORDER — FENTANYL CITRATE (PF) 100 MCG/2ML IJ SOLN: 25.0000 ug | INTRAMUSCULAR | Status: DC | PRN

## 2016-06-18 MED ORDER — LIDOCAINE HCL (PF) 1 % IJ SOLN: 10.0000 mL | Freq: Once | INTRAMUSCULAR | Status: DC

## 2016-06-18 NOTE — Progress Notes (Signed)
Safety precautions to be maintained throughout the outpatient stay will include: orient to surroundings, keep bed in low position, maintain call bell within reach at all times, provide assistance with transfer out of bed and ambulation.  

## 2016-06-18 NOTE — Patient Instructions (Signed)
Pain Management Discharge Instructions  General Discharge Instructions :  If you need to reach your doctor call: Monday-Friday 8:00 am - 4:00 pm at [phone] or toll free [phone].  After clinic hours [phone] to have operator reach doctor.  Bring all of your medication bottles to all your appointments in the pain clinic.  To cancel or reschedule your appointment with Pain Management please remember to call 24 hours in advance to avoid a fee.  Refer to the educational materials which you have been given on: General Risks, I had my Procedure. Discharge Instructions, Post Sedation.  Post Procedure Instructions:  The drugs you were given will stay in your system until tomorrow, so for the next 24 hours you should not drive, make any legal decisions or drink any alcoholic beverages.  You may eat anything you prefer, but it is better to start with liquids then soups and crackers, and gradually work up to solid foods.  Please notify your doctor immediately if you have any unusual bleeding, trouble breathing or pain that is not related to your normal pain.  Depending on the type of procedure that was done, some parts of your body may feel week and/or numb.  This usually clears up by tonight or the next day.  Walk with the use of an assistive device or accompanied by an adult for the 24 hours.  You may use ice on the affected area for the first 24 hours.  Put ice in a Ziploc bag and cover with a towel and place against area 15 minutes on 15 minutes off.  You may switch to heat after 24 hours.Epidural Steroid Injection An epidural steroid injection is a shot of steroid medicine and numbing medicine that is given into the space between the spinal cord and the bones in your back (epidural space). The shot helps relieve pain caused by an irritated or swollen nerve root. The amount of pain relief you get from the injection depends on what is causing the nerve to be swollen and irritated,  and how long your pain lasts. You are more likely to benefit from this injection if your pain is strong and comes on suddenly rather than if you have had pain for a long time. Tell a health care provider about:  Any allergies you have.  All medicines you are taking, including vitamins, herbs, eye drops, creams, and over-the-counter medicines.  Any problems you or family members have had with anesthetic medicines.  Any blood disorders you have.  Any surgeries you have had.  Any medical conditions you have.  Whether you are pregnant or may be pregnant. What are the risks? Generally, this is a safe procedure. However, problems may occur, including:  Headache.  Bleeding.  Infection.  Allergic reaction to medicines.  Damage to your nerves. What happens before the procedure? Staying hydrated  Follow instructions from your health care provider about hydration, which may include:  Up to 2 hours before the procedure - you may continue to drink clear liquids, such as water, clear fruit juice, black coffee, and plain tea. Eating and drinking restrictions  Follow instructions from your health care provider about eating and drinking, which may include:  8 hours before the procedure - stop eating heavy meals or foods such as meat, fried foods, or fatty foods.  6 hours before the procedure - stop eating light meals or foods, such as toast or cereal.  6 hours before the procedure - stop drinking milk or drinks that contain milk.    2 hours before the procedure - stop drinking clear liquids. Medicine  You may be given medicines to lower anxiety.  Ask your health care provider about:  Changing or stopping your regular medicines. This is especially important if you are taking diabetes medicines or blood thinners.  Taking medicines such as aspirin and ibuprofen. These medicines can thin your blood. Do not take these medicines before your procedure if your health care provider instructs  you not to. General instructions  Plan to have someone take you home from the hospital or clinic. What happens during the procedure?  You may receive a medicine to help you relax (sedative).  You will be asked to lie on your abdomen.  The injection site will be cleaned.  A numbing medicine (local anesthetic) will be used to numb the injection site.  A needle will be inserted through your skin into the epidural space. You may feel some discomfort when this happens. An X-ray machine will be used to make sure the needle is put as close as possible to the affected nerve.  A steroid medicine and a local anesthetic will be injected into the epidural space.  The needle will be removed.  A bandage (dressing) will be put over the injection site. What happens after the procedure?  Your blood pressure, heart rate, breathing rate, and blood oxygen level will be monitored until the medicines you were given have worn off.  Your arm or leg may feel weak or numb for a few hours.  The injection site may feel sore.  Do not drive for 24 hours if you received a sedative. This information is not intended to replace advice given to you by your health care provider. Make sure you discuss any questions you have with your health care provider. Document Released: 09/25/2007 Document Revised: 11/30/2015 Document Reviewed: 10/04/2015 [employer] Interactive Patient Education  2017 [employer]  

## 2016-06-18 NOTE — Progress Notes (Signed)
Patient's Name: Keith Fuller  MRN: [id number]  Referring Provider: Lynnea Ferrier, MD  DOB: [birth year]  PCP: Keith Ferrier, MD  DOS: 06/18/2016  Note by: Keith Levans. Laban Emperor, MD  Service setting: Ambulatory outpatient  Location: [facility] (AMB) Pain Management Facility  Visit type: Procedure  Specialty: Interventional Pain Management  Patient type: Established   Primary Reason for Visit: Interventional Pain Management Treatment. CC: Back Pain (lower, left)  Procedure:  Anesthesia, Analgesia, Anxiolysis:  Type: Diagnostic Epidural Steroid Injection Region: Caudal Level: Sacrococcygeal   Laterality: Midline aiming at the left    Type: Local Anesthesia with Moderate (Conscious) Sedation Local Anesthetic: Lidocaine 1% Route: Intravenous (IV) IV Access: Secured Sedation: Meaningful verbal contact was maintained at all times during the procedure  Indication(s): Analgesia and Anxiety  Indications: 1. Chronic low back pain (Location of Primary Source of Pain) (Bilateral) (L>R)   2. Chronic lower extremity pain (Location of Secondary source of pain) (Referred Pain) (Bilateral) (L>R)   3. Chronic pain syndrome   4. Chronic lumbar radicular pain (Location of Secondary source of pain) (Bilateral) (L>R)   5. 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 Lumbar spondylosis   8. Radiculopathy of sacral region    Pain Score: Pre-procedure: 3 /10 Post-procedure: 3 /10  Pre-Procedure Assessment:  Keith Fuller is a 65 y.o. (year old), male patient, seen today for interventional treatment. He  has a past surgical history that includes Joint replacement; Hernia repair; Prostate surgery; and Colonoscopy with propofol (N/A, 05/21/2016).. His primarily concern today is the Back Pain (lower, left) The primary encounter diagnosis was Chronic low back pain (Location of Primary Source of Pain) (Bilateral) (L>R). 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  Pain Type: Chronic pain Pain Location: Back Pain Orientation: Lower, Left Pain Descriptors / Indicators: Aching, Dull Pain Frequency: Intermittent  Date of Last Visit: 06/06/16 Service Provided on Last Visit: Evaluation  Coagulation Parameters Lab Results  Component Value Date   INR 0.9 02/09/2014   LABPROT 12.4 02/09/2014   APTT 27.1 02/09/2014   PLT 115 (L) 02/26/2014   Verification of the correct person, correct site (including marking of site), and correct procedure were performed and confirmed by the patient.  Consent: Before the procedure and under the influence of no sedative(s), amnesic(s), or anxiolytics, the patient was informed of the treatment options, risks and possible complications. To fulfill our ethical and legal obligations, as recommended by the American Medical Association's Code of Ethics, I have informed the patient of my clinical impression; the nature and purpose of the treatment or procedure; the risks, benefits, and possible complications of the intervention; the alternatives, including doing nothing; the risk(s) and benefit(s) of the alternative treatment(s) or procedure(s); and the risk(s) and benefit(s) of doing nothing. The patient was provided information about the general risks and possible complications associated with the procedure. These may include, but are not limited to: failure to achieve desired goals, infection, bleeding, organ or nerve damage, allergic reactions, paralysis, and death. In addition, the patient was informed of those risks and  complications associated to Spine-related procedures, such as failure  to decrease pain; infection (i.e.: Meningitis, epidural or intraspinal abscess); bleeding (i.e.: epidural hematoma, subarachnoid hemorrhage, or any other type of intraspinal or peri-dural bleeding); organ or nerve damage (i.e.: Any type of peripheral nerve, nerve root, or spinal cord injury) with subsequent damage to sensory, motor, and/or autonomic systems, resulting in permanent pain, numbness, and/or weakness of one or several areas of the body; allergic reactions; (i.e.: anaphylactic reaction); and/or death. Furthermore, the patient was informed of those risks and complications associated with the medications. These include, but are not limited to: allergic reactions (i.e.: anaphylactic or anaphylactoid reaction(s)); adrenal axis suppression; blood sugar elevation that in diabetics may result in ketoacidosis or comma; water retention that in patients with history of congestive heart failure may result in shortness of breath, pulmonary edema, and decompensation with resultant heart failure; weight gain; swelling or edema; medication-induced neural toxicity; particulate matter embolism and blood vessel occlusion with resultant organ, and/or nervous system infarction; and/or aseptic necrosis of one or more joints. Finally, the patient was informed that Medicine is not an exact science; therefore, there is also the possibility of unforeseen or unpredictable risks and/or possible complications that may result in a catastrophic outcome. The patient indicated having understood very clearly. We have given the patient no guarantees and we have made no promises. Enough time was given to the patient to ask questions, all of which were answered to the patient's satisfaction. Keith Fuller has indicated that he wanted to continue with the procedure.  Consent Attestation: I, the ordering provider, attest that I have discussed with the patient the  benefits, risks, side-effects, alternatives, likelihood of achieving goals, and potential problems during recovery for the procedure that I have provided informed consent.  Pre-Procedure Preparation:  Safety Precautions: Allergies reviewed. The patient was asked about blood thinners, or active infections, both of which were denied. The patient was asked to confirm the procedure and laterality, before marking the site, and again before commencing the procedure. Appropriate site, procedure, and patient were confirmed by following the Joint Commission's Universal Protocol (UP.01.01.01), in the form of a "Time Out". The patient was asked to participate by confirming the accuracy of the "Time Out" information. Patient was assessed for positional comfort and pressure points before starting the procedure. Allergies: He is allergic to penicillins. Allergy Precautions: None required Infection Control Precautions: Sterile technique used. Standard Universal Precautions were taken as recommended by the Department of [facility] for Disease Control and Prevention (CDC). Standard pre-surgical skin prep was conducted. Respiratory hygiene and cough etiquette was practiced. Hand hygiene observed. Safe injection practices and needle disposal techniques followed. SDV (single dose vial) medications used. Medications properly checked for expiration dates and contaminants. Personal protective equipment (PPE) used as per protocol. Monitoring:  As per clinic protocol. Vitals:   06/18/16 1050 06/18/16 1100 06/18/16 1110 06/18/16 1120  BP: (!) 148/94 122/75 (!) 116/96 112/90  Pulse: 60 60 (!) 56 (!) 56  Resp: 16 16 15 16   Temp:  (!) 96.8 F (36 C)    SpO2: 99% 100% 97% 100%  Weight:      Height:      Calculated BMI: Body mass index is 29.53 kg/m. Time-out: "Time-out" completed before starting procedure, as per protocol.  Imaging Review  Lumbosacral Imaging: Lumbar CT wo contrast:  Results for orders placed  in visit on 07/14/13  CT Lumbar Spine Wo Contrast   Narrative * PRIOR REPORT IMPORTED FROM AN EXTERNAL SYSTEM *   CLINICAL DATA:  Low back pain for  2 months. Evaluate herniated disc.   EXAM:  CT LUMBAR SPINE WITHOUT CONTRAST   TECHNIQUE:  Multidetector CT imaging of the lumbar spine was performed without  intravenous contrast administration. Multiplanar CT image  reconstructions were also generated.   COMPARISON:  Lumbar spine radiographs 06/15/2013   FINDINGS:  There is trace retrolisthesis of L2 on L3. Vertebral body heights  are preserved without evidence of compression fracture. Small  sclerotic foci in the L3 left superior articular process and T12,  L2, and L5 vertebral bodies are suggestive of bone islands. The  visualized paraspinal soft tissues are unremarkable.   T12-L1:  Negative.   L1-2: Mild to moderate disc space narrowing with anterior spurring.  Mild to moderate circumferential disc bulge without spinal canal or  neural foraminal stenosis.   L2-3: Mild to moderate disc space narrowing with vacuum disc  phenomenon and anterior spurring. Mild to moderate circumferential  disc bulge flattens the ventral thecal sac with borderline spinal  canal narrowing. There is mild right neural foraminal narrowing.   L3-4: Listhesis and mild disc bulge results in mild spinal stenosis  with lateral recess narrowing. There is mild to moderate facet  arthrosis. No neural foraminal stenosis.   L4-5: Mild disc bulge eccentric to the left results in mild to  moderate left lateral recess narrowing and mild left neural  foraminal narrowing. Mild facet arthrosis.   L5-S1: Mild disc bulge without spinal canal or neural foraminal  stenosis. There is moderate facet arthrosis.   IMPRESSION:  Mild to moderate multilevel degenerative disc disease and facet  arthrosis as above, with the lateral recess narrowing at L3-4  bilaterally and L4-5 on the left.    Electronically Signed     By: Sebastian Ache    On: 07/14/2013 08:48       Lumbar CT w contrast:  Results for orders placed during the hospital encounter of 03/19/16  CT LUMBAR SPINE W CONTRAST   Narrative CLINICAL DATA:  Low back and buttock pain with some extension to the left leg.  EXAM: LUMBAR MYELOGRAM  FLUOROSCOPY TIME:  0 minutes 58 seconds. 264.36 micro gray meter squared  PROCEDURE: After thorough discussion of risks and benefits of the procedure including bleeding, infection, injury to nerves, blood vessels, adjacent structures as well as headache and CSF leak, written and oral informed consent was obtained. Consent was obtained by Dr. Paulina Fusi. Time out form was completed.  Patient was positioned prone on the fluoroscopy table. Local anesthesia was provided with 1% lidocaine without epinephrine after prepped and draped in the usual sterile fashion. Puncture was performed at L4-5 using a 3 1/2 inch 22-gauge spinal needle via left para median approach. Using a single pass through the dura, the needle was placed within the thecal sac, with return of clear CSF. 15 mL of Isovue 200 was injected into the thecal sac, with normal opacification of the nerve roots and cauda equina consistent with free flow within the subarachnoid space.  I personally performed the lumbar puncture and administered the intrathecal contrast. I also personally performed acquisition of the myelogram images.  TECHNIQUE: Contiguous axial images were obtained through the Lumbar spine after the intrathecal infusion of infusion. Coronal and sagittal reconstructions were obtained of the axial image sets.  COMPARISON:  CT 07/14/2013  FINDINGS: LUMBAR MYELOGRAM FINDINGS:  Mild curvature convex to the left. Mild stenosis of the lateral recesses at L1-2. Moderate stenosis at L2-3 right worse than left. Mild stenosis of the lateral recesses L3-4 left  worse than right. Moderate stenosis bilaterally at L4-5. Moderate  stenosis bilaterally at L5-S1. Anterior extradural defects seen on the lateral views. 3 mm anterolisthesis at L5-S1 in the supine position. With standing, this increases to 9 mm in with flexion to 11 mm.  CT LUMBAR MYELOGRAM FINDINGS:  T11-12: Bilateral facet arthropathy.  No canal stenosis.  T12-L1: Bilateral facet arthropathy. Mild bulging of the disc towards the left. No compressive stenosis.  L1-2: Circumferential protrusion of the disc with upward migration of herniated disc material in the midline behind L1. Facet hypertrophy. Mild narrowing of the lateral recesses but without distinct neural compression. Conus tip at this level.  L2-3: Disc degeneration with loss of disc height more on the right. Endplate osteophytes worse on the right. Broad-based protrusion of disc material with upward migration of herniated disc material in the right lateral recess behind L2. Stenosis of both lateral recesses and neural foramina that could cause neural compression, particularly on the right.  L3-4: Circumferential protrusion of the disc. Bilateral facet and ligamentous hypertrophy. Mild stenosis of both lateral recesses and neural foramina.  L4-5: Circumferential protrusion of the disc more prominent towards the left. Bilateral facet and ligamentous hypertrophy. Stenosis of both lateral recesses and foramina, left worse than right.  L5-S1: Advanced bilateral facet arthropathy. Anterolisthesis of 4 mm in this position. Circumferential protrusion of the disc. Narrowing of both subarticular lateral recesses and both neural foramina that could cause neural compression. This would worsen with standing flexion.  IMPRESSION: T11-12 and T12-L1: Facet arthropathy without neural compression.  L1-2: Shallow broad-based disc herniation with upward migration of disc material in the midline behind L1. Mild facet hypertrophy. Mild narrowing of the lateral recesses but without neural  compression.  L2-3: Disc degeneration more pronounced on the right. Endplate osteophytes more pronounced on the right. Shallow protrusion of the disc more prominent towards the right with upward migration behind L2. Stenosis of the lateral recesses and foramina, right worse than left. Neural compression could occur at this level, particularly on the right.  L3-4: Circumferential protrusion of the disc. Bilateral facet hypertrophy. Mild lateral recess and foraminal narrowing bilaterally.  L4-5: Shallow protrusion of the disc slightly more towards the left. Facet and ligamentous hypertrophy. Stenosis of the lateral recesses and foramina left more than right.  L5-S1: Advanced bilateral facet arthropathy. 4 mm of anterolisthesis which increases to 9 mm with standing and 11 mm with standing flexion. Stenosis of the lateral recesses and foramina that could cause neural compression, the appearance of which would worsen with standing and flexion.   Electronically Signed   By: Paulina Fusi M.D.   On: 03/19/2016 11:42    Lumbar DG 2-3 views:  Results for orders placed in visit on 06/15/13  DG Lumbar Spine 2-3 Views   Narrative * PRIOR REPORT IMPORTED FROM AN EXTERNAL SYSTEM *   CLINICAL DATA:  Low back pain radiating down the right leg. Leg  numbness.   EXAM:  LUMBAR SPINE - 2-3 VIEW   COMPARISON:  None.   FINDINGS:  Alignment is anatomic. Vertebral body height is maintained. Endplate  degenerative changes and slight loss of disc space height at L1-2  and L2-3. Milder endplate degenerative changes at L3-4 and L4-5.  Facet hypertrophy in the lower lumbar spine. Degenerative changes  are also seen in the sacroiliac joints.   IMPRESSION:  Multilevel spondylosis, worst at L1-2 and L2-3.    Electronically Signed    By: Leanna Battles M.D.    On: 06/15/2013  12:40       Lumbar DG Bending views:  Results for orders placed during the hospital encounter of 08/15/15  DG  Lumbar Spine Complete W/Bend   Narrative CLINICAL DATA:  Back pain for months. Weakness radiating down legs. No known injury.  EXAM: LUMBAR SPINE - COMPLETE WITH BENDING VIEWS  COMPARISON:  07/28/2013  FINDINGS: Degenerative disc disease throughout the lumbar spine, most pronounced at L2-3 with disc space narrowing and spurring. Degenerative facet disease diffusely throughout the lumbar spine, most pronounced from L3-4 through L5-S1. Worsening anterolisthesis of L5 on S1, approximately 5 mm. No instability with flexion or extension. SI joints are symmetric and unremarkable.  IMPRESSION: Degenerative disc and facet disease throughout the lumbar spine. Increasing anterolisthesis of L5 on S1 related to facet disease.   Electronically Signed   By: Charlett NoseKevin  Dover M.D.   On: 08/15/2015 11:50    Lumbar DG Myelogram Lumbosacral:  Results for orders placed during the hospital encounter of 03/19/16  DG MYELOGRAPHY LUMBAR INJ LUMBOSACRAL   Narrative CLINICAL DATA:  Low back and buttock pain with some extension to the left leg.  EXAM: LUMBAR MYELOGRAM  FLUOROSCOPY TIME:  0 minutes 58 seconds. 264.36 micro gray meter squared  PROCEDURE: After thorough discussion of risks and benefits of the procedure including bleeding, infection, injury to nerves, blood vessels, adjacent structures as well as headache and CSF leak, written and oral informed consent was obtained. Consent was obtained by Dr. Paulina FusiMark Shogry. Time out form was completed.  Patient was positioned prone on the fluoroscopy table. Local anesthesia was provided with 1% lidocaine without epinephrine after prepped and draped in the usual sterile fashion. Puncture was performed at L4-5 using a 3 1/2 inch 22-gauge spinal needle via left para median approach. Using a single pass through the dura, the needle was placed within the thecal sac, with return of clear CSF. 15 mL of Isovue 200 was injected into the thecal sac, with  normal opacification of the nerve roots and cauda equina consistent with free flow within the subarachnoid space.  I personally performed the lumbar puncture and administered the intrathecal contrast. I also personally performed acquisition of the myelogram images.  TECHNIQUE: Contiguous axial images were obtained through the Lumbar spine after the intrathecal infusion of infusion. Coronal and sagittal reconstructions were obtained of the axial image sets.  COMPARISON:  CT 07/14/2013  FINDINGS: LUMBAR MYELOGRAM FINDINGS:  Mild curvature convex to the left. Mild stenosis of the lateral recesses at L1-2. Moderate stenosis at L2-3 right worse than left. Mild stenosis of the lateral recesses L3-4 left worse than right. Moderate stenosis bilaterally at L4-5. Moderate stenosis bilaterally at L5-S1. Anterior extradural defects seen on the lateral views. 3 mm anterolisthesis at L5-S1 in the supine position. With standing, this increases to 9 mm in with flexion to 11 mm.  CT LUMBAR MYELOGRAM FINDINGS:  T11-12: Bilateral facet arthropathy.  No canal stenosis.  T12-L1: Bilateral facet arthropathy. Mild bulging of the disc towards the left. No compressive stenosis.  L1-2: Circumferential protrusion of the disc with upward migration of herniated disc material in the midline behind L1. Facet hypertrophy. Mild narrowing of the lateral recesses but without distinct neural compression. Conus tip at this level.  L2-3: Disc degeneration with loss of disc height more on the right. Endplate osteophytes worse on the right. Broad-based protrusion of disc material with upward migration of herniated disc material in the right lateral recess behind L2. Stenosis of both lateral recesses and neural foramina that could  cause neural compression, particularly on the right.  L3-4: Circumferential protrusion of the disc. Bilateral facet and ligamentous hypertrophy. Mild stenosis of both lateral recesses  and neural foramina.  L4-5: Circumferential protrusion of the disc more prominent towards the left. Bilateral facet and ligamentous hypertrophy. Stenosis of both lateral recesses and foramina, left worse than right.  L5-S1: Advanced bilateral facet arthropathy. Anterolisthesis of 4 mm in this position. Circumferential protrusion of the disc. Narrowing of both subarticular lateral recesses and both neural foramina that could cause neural compression. This would worsen with standing flexion.  IMPRESSION: T11-12 and T12-L1: Facet arthropathy without neural compression.  L1-2: Shallow broad-based disc herniation with upward migration of disc material in the midline behind L1. Mild facet hypertrophy. Mild narrowing of the lateral recesses but without neural compression.  L2-3: Disc degeneration more pronounced on the right. Endplate osteophytes more pronounced on the right. Shallow protrusion of the disc more prominent towards the right with upward migration behind L2. Stenosis of the lateral recesses and foramina, right worse than left. Neural compression could occur at this level, particularly on the right.  L3-4: Circumferential protrusion of the disc. Bilateral facet hypertrophy. Mild lateral recess and foraminal narrowing bilaterally.  L4-5: Shallow protrusion of the disc slightly more towards the left. Facet and ligamentous hypertrophy. Stenosis of the lateral recesses and foramina left more than right.  L5-S1: Advanced bilateral facet arthropathy. 4 mm of anterolisthesis which increases to 9 mm with standing and 11 mm with standing flexion. Stenosis of the lateral recesses and foramina that could cause neural compression, the appearance of which would worsen with standing and flexion.   Electronically Signed   By: Paulina Fusi M.D.   On: 03/19/2016 11:42    Description of Procedure Process:   Time-out: "Time-out" completed before starting procedure, as per  protocol. Position: Prone Target Area: Caudal Epidural Canal. Approach: Midline approach. Area Prepped: Entire Posterior Sacrococcygeal Region Prepping solution: ChloraPrep (2% chlorhexidine gluconate and 70% isopropyl alcohol) Safety Precautions: Aspiration looking for blood return was conducted prior to all injections. At no point did we inject any substances, as a needle was being advanced. No attempts were made at seeking any paresthesias. Safe injection practices and needle disposal techniques used. Medications properly checked for expiration dates. SDV (single dose vial) medications used. Description of the Procedure: Protocol guidelines were followed. The patient was placed in position over the fluoroscopy table. The target area was identified and the area prepped in the usual manner. Skin desensitized using vapocoolant spray. Skin & deeper tissues infiltrated with local anesthetic. Appropriate amount of time allowed to pass for local anesthetics to take effect. The procedure needles were then advanced to the target area. Proper needle placement secured. Negative aspiration confirmed. Solution injected in intermittent fashion, asking for systemic symptoms every 0.5cc of injectate. The needles were then removed and the area cleansed, making sure to leave some of the prepping solution back to take advantage of its long term bactericidal properties. EBL: None Materials & Medications:  Needle(s) Type: Epidural needle Gauge: 22G Length: 3.5-in Medication(s): We administered lidocaine (PF), ropivacaine (PF) 2 mg/mL (0.2%), triamcinolone acetonide, midazolam, fentaNYL, sodium chloride, and methylPREDNISolone acetate. Please see chart orders for dosing details.  Imaging Guidance (Spinal):  Type of Imaging Technique: Fluoroscopy Guidance (Spinal) Indication(s): Assistance in needle guidance and placement for procedures requiring needle placement in or near specific anatomical locations not easily  accessible without such assistance. Exposure Time: Please see nurses notes. Contrast: Before injecting any contrast, we confirmed that  the patient did not have an allergy to iodine, shellfish, or radiological contrast. Once satisfactory needle placement was completed at the desired level, radiological contrast was injected. Contrast injected under live fluoroscopy. No contrast complications. See chart for type and volume of contrast used. Fluoroscopic Guidance: I was personally present during the use of fluoroscopy. "Tunnel Vision Technique" used to obtain the best possible view of the target area. Parallax error corrected before commencing the procedure. "Direction-depth-direction" technique used to introduce the needle under continuous pulsed fluoroscopy. Once target was reached, antero-posterior, oblique, and lateral fluoroscopic projection used confirm needle placement in all planes. Images permanently stored in EMR. Interpretation: I personally interpreted the imaging intraoperatively. Adequate needle placement confirmed in multiple planes. Appropriate spread of contrast into desired area was observed. No evidence of afferent or efferent intravascular uptake. No intrathecal or subarachnoid spread observed. Permanent images saved into the patient's record.  Antibiotic Prophylaxis:  Indication(s): No indications identified. Type:  Antibiotics Given (last 72 hours)    None      Post-operative Assessment:  Complications: No immediate post-treatment complications observed by team, or reported by patient. Disposition: The patient tolerated the entire procedure well. A repeat set of vitals were taken after the procedure and the patient was kept under observation following institutional policy, for this type of procedure. Post-procedural neurological assessment was performed, showing return to baseline, prior to discharge. The patient was provided with post-procedure discharge instructions, including a  section on how to identify potential problems. Should any problems arise concerning this procedure, the patient was given instructions to immediately contact us, at any time, without hesitation. In any case, we plan to contact the patient by telephone for a follow-up status report regarding this interventional procedure. Comments:  No additional relevant information.  Plan of Care  Discharge to: Discharge home  Medications ordered for procedure: Meds ordered this encounter  Medications  . fentaNYL (SUBLIMAZE) injection 25-50 mcg    Make sure Narcan is available in the pyxis when using this medication. In the event of respiratory depression (RR< 8/min): Titrate NARCAN (naloxone) in increments of 0.1 to 0.2 mg IV at 2-3 minute intervals, until desired degree of reversal.  . lactated ringers infusion 1,000 mL  . midazolam (VERSED) 5 MG/5ML injection 1-2 mg    Make sure Flumazenil is available in the pyxis when using this medication. If oversedation occurs, administer 0.2 mg IV over 15 sec. If after 45 sec no response, administer 0.2 mg again over 1 min; may repeat at 1 min intervals; not to exceed 4 doses (1 mg)  . iopamidol (ISOVUE-M) 41 % intrathecal injection 10 mL  . triamcinolone acetonide (KENALOG-40) injection 40 mg  . lidocaine (PF) (XYLOCAINE) 1 % injection 10 mL  . ropivacaine (PF) 2 mg/mL (0.2%) (NAROPIN) injection 2 mL  . sodium chloride flush (NS) 0.9 % injection 2 mL  . lidocaine (PF) (XYLOCAINE) 1 % injection    Jarold Motto, Delores: cabinet override  . ropivacaine (PF) 2 mg/mL (0.2%) (NAROPIN) 2 MG/ML injection    Jarold Motto, Delores: cabinet override  . triamcinolone acetonide (KENALOG-40) 40 MG/ML injection    Jarold Motto, Delores: cabinet override  . midazolam (VERSED) 5 MG/5ML injection    Jarold Motto, Delores: cabinet override  . fentaNYL (SUBLIMAZE) 100 MCG/2ML injection    Jarold Motto, Delores: cabinet override  . sodium chloride 0.9 % injection    Jarold Motto, Delores:  cabinet override  . methylPREDNISolone acetate (DEPO-MEDROL) 80 MG/ML injection    GARNER, CYNTHIA: cabinet override   Medications administered: (For  more details, see medical record) We administered lidocaine (PF), ropivacaine (PF) 2 mg/mL (0.2%), triamcinolone acetonide, midazolam, fentaNYL, sodium chloride, and methylPREDNISolone acetate. Lab-work, Procedure(s), & Referral(s) Ordered: Orders Placed This Encounter  Procedures  . Caudal Epidural Injection  . DG C-Arm 1-60 Min-No Report   Imaging Ordered: Results for orders placed in visit on 05/01/16  DG C-Arm 1-60 Min-No Report   Narrative CLINICAL DATA: Assistance in needle guidance and placement for procedures  requiring needle placement in or near specific anatomical locations not  easily accessible without such assistance.   C-ARM 1-60 MINUTES  Fluoroscopy was utilized by the requesting physician.  No radiographic  interpretation.     New Prescriptions   No medications on file   Physician-requested Follow-up:  No Follow-up on file.  Future Appointments Date Time Provider Department Center  08/13/2016 1:00 PM Delano MetzFrancisco Manaia Samad, MD [facility] None   Primary Care Physician: Keith FerrierBert J Klein III, MD [facility] Note by: Keith LevansFrancisco A. Laban EmperorNaveira, M.D, DABA, DABAPM, DABPM, DABIPP, FIPP Date: 06/18/16; Time: 1:30 PM  Disclaimer:  Medicine is not an Visual merchandiserexact science. The only guarantee in medicine is that nothing is guaranteed. It is important to note that the decision to proceed with this intervention was based on the information collected from the patient. The Data and conclusions were drawn from the patient's questionnaire, the interview, and the physical examination. Because the information was provided in large part by the patient, it cannot be guaranteed that it has not been purposely or unconsciously manipulated. Every effort has been made to obtain as much relevant data as possible for this  evaluation. It is important to note that the conclusions that lead to this procedure are derived in large part from the available data. Always take into account that the treatment will also be dependent on availability of resources and existing treatment guidelines, considered by other Pain Management Practitioners as being common knowledge and practice, at the time of the intervention. For Medico-Legal purposes, it is also important to point out that variation in procedural techniques and pharmacological choices are the acceptable norm. The indications, contraindications, technique, and results of the above procedure should only be interpreted and judged by a Board-Certified Interventional Pain Specialist with extensive familiarity and expertise in the same exact procedure and technique. Attempts at providing opinions without similar or greater experience and expertise than that of the treating physician will be considered as inappropriate and unethical, and shall result in a formal complaint to the state medical board and applicable specialty societies.  Instructions provided at this appointment: Patient Instructions  Pain Management Discharge Instructions  General Discharge Instructions :  If you need to reach your doctor call: Monday-Friday 8:00 am - 4:00 pm at [phone] or toll free [phone].  After clinic hours [phone] to have operator reach doctor.  Bring all of your medication bottles to all your appointments in the pain clinic.  To cancel or reschedule your appointment with Pain Management please remember to call 24 hours in advance to avoid a fee.  Refer to the educational materials which you have been given on: General Risks, I had my Procedure. Discharge Instructions, Post Sedation.  Post Procedure Instructions:  The drugs you were given will stay in your system until tomorrow, so for the next 24 hours you should not drive, make any legal decisions or drink any alcoholic  beverages.  You may eat anything you prefer, but it is better to start with liquids then soups and crackers, and  gradually work up to solid foods.  Please notify your doctor immediately if you have any unusual bleeding, trouble breathing or pain that is not related to your normal pain.  Depending on the type of procedure that was done, some parts of your body may feel week and/or numb.  This usually clears up by tonight or the next day.  Walk with the use of an assistive device or accompanied by an adult for the 24 hours.  You may use ice on the affected area for the first 24 hours.  Put ice in a Ziploc bag and cover with a towel and place against area 15 minutes on 15 minutes off.  You may switch to heat after 24 hours.Epidural Steroid Injection An epidural steroid injection is a shot of steroid medicine and numbing medicine that is given into the space between the spinal cord and the bones in your back (epidural space). The shot helps relieve pain caused by an irritated or swollen nerve root. The amount of pain relief you get from the injection depends on what is causing the nerve to be swollen and irritated, and how long your pain lasts. You are more likely to benefit from this injection if your pain is strong and comes on suddenly rather than if you have had pain for a long time. Tell a health care provider about:  Any allergies you have.  All medicines you are taking, including vitamins, herbs, eye drops, creams, and over-the-counter medicines.  Any problems you or family members have had with anesthetic medicines.  Any blood disorders you have.  Any surgeries you have had.  Any medical conditions you have.  Whether you are pregnant or may be pregnant. What are the risks? Generally, this is a safe procedure. However, problems may occur, including:  Headache.  Bleeding.  Infection.  Allergic reaction to medicines.  Damage to your nerves. What happens before the  procedure? Staying hydrated  Follow instructions from your health care provider about hydration, which may include:  Up to 2 hours before the procedure - you may continue to drink clear liquids, such as water, clear fruit juice, black coffee, and plain tea. Eating and drinking restrictions  Follow instructions from your health care provider about eating and drinking, which may include:  8 hours before the procedure - stop eating heavy meals or foods such as meat, fried foods, or fatty foods.  6 hours before the procedure - stop eating light meals or foods, such as toast or cereal.  6 hours before the procedure - stop drinking milk or drinks that contain milk.  2 hours before the procedure - stop drinking clear liquids. Medicine  You may be given medicines to lower anxiety.  Ask your health care provider about:  Changing or stopping your regular medicines. This is especially important if you are taking diabetes medicines or blood thinners.  Taking medicines such as aspirin and ibuprofen. These medicines can thin your blood. Do not take these medicines before your procedure if your health care provider instructs you not to. General instructions  Plan to have someone take you home from the hospital or clinic. What happens during the procedure?  You may receive a medicine to help you relax (sedative).  You will be asked to lie on your abdomen.  The injection site will be cleaned.  A numbing medicine (local anesthetic) will be used to numb the injection site.  A needle will be inserted through your skin into the epidural space. You may  feel some discomfort when this happens. An X-ray machine will be used to make sure the needle is put as close as possible to the affected nerve.  A steroid medicine and a local anesthetic will be injected into the epidural space.  The needle will be removed.  A bandage (dressing) will be put over the injection site. What happens after the  procedure?  Your blood pressure, heart rate, breathing rate, and blood oxygen level will be monitored until the medicines you were given have worn off.  Your arm or leg may feel weak or numb for a few hours.  The injection site may feel sore.  Do not drive for 24 hours if you received a sedative. This information is not intended to replace advice given to you by your health care provider. Make sure you discuss any questions you have with your health care provider. Document Released: 09/25/2007 Document Revised: 11/30/2015 Document Reviewed: 10/04/2015 Elsevier Interactive Patient Education  2017 [employer].

## 2016-06-19 ENCOUNTER — Telehealth: Payer: Self-pay | Admitting: *Deleted

## 2016-06-19 NOTE — Telephone Encounter (Signed)
Patient's wife stated that he is doing fine with no problems.

## 2016-08-13 ENCOUNTER — Ambulatory Visit: Payer: BLUE CROSS/BLUE SHIELD | Admitting: Pain Medicine

## 2017-09-23 ENCOUNTER — Encounter: Payer: Self-pay | Admitting: Urology

## 2017-09-23 ENCOUNTER — Ambulatory Visit (INDEPENDENT_AMBULATORY_CARE_PROVIDER_SITE_OTHER): Payer: Medicare Other | Admitting: Urology

## 2017-09-23 VITALS — BP 147/76 | HR 64 | Resp 16 | Ht 69.0 in | Wt 220.6 lb

## 2017-09-23 DIAGNOSIS — R972 Elevated prostate specific antigen [PSA]: Secondary | ICD-10-CM | POA: Diagnosis not present

## 2017-09-23 NOTE — Progress Notes (Signed)
09/23/2017 10:32 AM   Keith Fuller 06/20/1951 [id number]  Referring provider: Lynnea Ferrier, MD [address] [facility] [place], [place] [address]  Chief complaint: Follow-up  HPI: 67 year old male presents for follow-up of an elevated PSA.  I last saw him at [facility] in October 2017.  He has a long history of an elevated PSA up to the 40 range with multiple negative biopsies including saturation biopsies.  He remains on finasteride and his last PSA in October 2017 was stable at 8.84 (uncorrected).  He has no bothersome lower urinary tract symptoms.  Denies dysuria or gross hematuria.  Denies flank, abdominal, pelvic or scrotal pain.   PMH: Past Medical History:  Diagnosis Date  . Allergy   . Arthritis   . Hepatitis C    denies any virus in system now    Surgical History: Past Surgical History:  Procedure Laterality Date  . COLONOSCOPY WITH PROPOFOL N/A 05/21/2016   Procedure: COLONOSCOPY WITH PROPOFOL;  Surgeon: Scot Jun, MD;  Location: [facility] ENDOSCOPY;  Service: Endoscopy;  Laterality: N/A;  Gentamicin and Vacomycin IVPB  . HERNIA REPAIR    . JOINT REPLACEMENT    . PROSTATE SURGERY     biopsy    Home Medications:  Allergies as of 09/23/2017      Reactions   Penicillins    Made me feel odd       Medication List        Accurate as of 09/23/17 10:32 AM. Always use your most recent med list.          cyclobenzaprine 10 MG tablet Commonly known as:  FLEXERIL Take 1 tablet (10 mg total) by mouth 3 (three) times daily as needed for muscle spasms.   finasteride 5 MG tablet Commonly known as:  PROSCAR Take 5 mg by mouth daily.   FINASTERIDE PO Take 5 mg by mouth every morning.   Fish Oil 1000 MG Cpdr Take 1,000 mg by mouth every morning.   omeprazole 20 MG capsule Commonly known as:  PRILOSEC Take 20 mg by mouth every morning.   omeprazole 40 MG capsule Commonly known as:  PRILOSEC       Allergies:  Allergies  Allergen  Reactions  . Penicillins     Made me feel odd     Family History: Family History  Problem Relation Age of Onset  . Arthritis Mother   . Cancer Father   . Stroke Father     Social History:  reports that he has quit smoking. He has never used smokeless tobacco. He reports that he drinks alcohol. He reports that he does not use drugs.  ROS: UROLOGY Frequent Urination?: Yes Hard to postpone urination?: No Burning/pain with urination?: No Get up at night to urinate?: Yes Leakage of urine?: No Urine stream starts and stops?: No Trouble starting stream?: Yes Do you have to strain to urinate?: No Blood in urine?: No Urinary tract infection?: No Sexually transmitted disease?: No Injury to kidneys or bladder?: No Painful intercourse?: No Weak stream?: No Erection problems?: Yes Penile pain?: No  Gastrointestinal Nausea?: No Vomiting?: No Indigestion/heartburn?: No Diarrhea?: No Constipation?: No  Constitutional Fever: No Night sweats?: No Weight loss?: No Fatigue?: No  Skin Skin rash/lesions?: No Itching?: No  Eyes Blurred vision?: Yes Double vision?: No  Ears/Nose/Throat Sore throat?: No Sinus problems?: No  Hematologic/Lymphatic Swollen glands?: No Easy bruising?: No  Cardiovascular Leg swelling?: No Chest pain?: No  Respiratory Cough?: No Shortness  of breath?: No  Endocrine Excessive thirst?: No  Musculoskeletal Back pain?: Yes Joint pain?: No  Neurological Headaches?: No  Psychologic Depression?: No Anxiety?: No  Physical Exam: BP (!) 147/76   Pulse 64   Resp 16   Ht 5\' 9"  (1.753 m)   Wt 220 lb 9.6 oz (100.1 kg)   SpO2 98%   BMI 32.58 kg/m   Constitutional:  Alert and oriented, No acute distress. HEENT: [place] AT, moist mucus membranes.  Trachea midline, no masses. Cardiovascular: No clubbing, cyanosis, or edema. Respiratory: Normal respiratory effort, no increased work of breathing. GI: Abdomen is soft, nontender, nondistended,  no abdominal masses GU: No CVA tenderness.  Prostate 40 g, smooth without nodules. Lymph: No cervical or inguinal lymphadenopathy. Skin: No rashes, bruises or suspicious lesions. Neurologic: Grossly intact, no focal deficits, moving all 4 extremities. Psychiatric: Normal mood and affect.   Assessment & Plan:    1. Elevated PSA 67 year old male with elevated PSA and multiple negative biopsies.  He remains on finasteride.  DRE is benign.  PSA was ordered and he will be notified with results.  If stable he will continue annual follow-up.    Riki AltesScott C Trequan Marsolek, MD  [facility] [address], [address] [place], [place] [address] [phone]

## 2017-09-24 LAB — PSA: Prostate Specific Ag, Serum: 20.1 ng/mL — ABNORMAL HIGH (ref 0.0–4.0)

## 2017-09-25 ENCOUNTER — Telehealth: Payer: Self-pay | Admitting: Urology

## 2017-09-25 DIAGNOSIS — R972 Elevated prostate specific antigen [PSA]: Secondary | ICD-10-CM

## 2017-09-25 NOTE — Telephone Encounter (Signed)
Thayer OhmChris is calling the patient to let him know and we will just need an order for the MRI.  Thanks, Marcelino DusterMichelle

## 2017-09-25 NOTE — Telephone Encounter (Signed)
Called and informed pt of his psa levels. Pt states he is unable to have an MRI due to having metal parts in his body, specifically behind his eye. Pt would like to know what he should do instead.

## 2017-09-25 NOTE — Telephone Encounter (Signed)
The only other options would be continuing to follow the PSA and repeat in 4 months or scheduling a repeat prostate biopsy.

## 2017-09-26 NOTE — Telephone Encounter (Signed)
Called and spoke w/ pt. Pt states that he will wait the 4 months and repeat psa. Pt states he is not actually sure if he has any metal behind his eye, it was mentioned to him during an orbital he had done once. Pt would like to know if we can order an orbital to rule out if he can have MRI or not. I spoke w/ Dr. Lonna CobbStoioff, he authorized an orbital xray to rule out possibility of anything behind pt eye. Have also ordered MRI in case pt is cleared for MRI.

## 2017-09-26 NOTE — Telephone Encounter (Signed)
lmom for pt call back.

## 2017-09-27 ENCOUNTER — Ambulatory Visit
Admission: RE | Admit: 2017-09-27 | Discharge: 2017-09-27 | Disposition: A | Discharge status: Home / Self Care | Payer: Medicare Other | Source: Ambulatory Visit | Attending: Urology | Admitting: Urology

## 2017-09-27 ENCOUNTER — Other Ambulatory Visit: Payer: Self-pay | Admitting: Urology

## 2017-09-27 DIAGNOSIS — R972 Elevated prostate specific antigen [PSA]: Secondary | ICD-10-CM

## 2017-09-27 DIAGNOSIS — Z1810 Retained metal fragments, unspecified: Secondary | ICD-10-CM | POA: Insufficient documentation

## 2017-09-27 DIAGNOSIS — Z1389 Encounter for screening for other disorder: Secondary | ICD-10-CM | POA: Insufficient documentation

## 2017-09-30 NOTE — Telephone Encounter (Signed)
-----   Message from Riki AltesScott C Stoioff, MD sent at 09/27/2017 12:31 PM EDT ----- Orbital series did show a 3 mm metallic fragment over the left maxillary sinus.  Need to find out from radiology if this would preclude a prostate MRI.

## 2017-09-30 NOTE — Telephone Encounter (Signed)
[facility] and he states that patient can go ahead with MRI , there is some minimal risk involved , patient will need to communicate with tech and let them know if he feels uncomfortable with heat coming from the metal fragment.   Spoke with patient and notified him of risks involved , he states he thinks he would like to do the MRI but will think it over for a few days and call back with his decision.

## 2017-10-07 ENCOUNTER — Other Ambulatory Visit: Payer: Self-pay | Admitting: Urology

## 2017-10-07 DIAGNOSIS — R972 Elevated prostate specific antigen [PSA]: Secondary | ICD-10-CM

## 2017-10-16 ENCOUNTER — Other Ambulatory Visit: Payer: Self-pay | Admitting: Internal Medicine

## 2017-10-16 DIAGNOSIS — R1032 Left lower quadrant pain: Secondary | ICD-10-CM

## 2017-10-16 DIAGNOSIS — R972 Elevated prostate specific antigen [PSA]: Secondary | ICD-10-CM

## 2017-10-24 ENCOUNTER — Ambulatory Visit
Admission: RE | Admit: 2017-10-24 | Discharge: 2017-10-24 | Disposition: A | Discharge status: Home / Self Care | Payer: Medicare Other | Source: Ambulatory Visit | Attending: Urology | Admitting: Urology

## 2017-10-24 DIAGNOSIS — R972 Elevated prostate specific antigen [PSA]: Secondary | ICD-10-CM

## 2017-10-24 MED ORDER — GADOBENATE DIMEGLUMINE 529 MG/ML IV SOLN
20.0000 mL | Freq: Once | INTRAVENOUS | Status: AC | PRN
  Administered 2017-10-24: 20 mL via INTRAVENOUS

## 2017-10-31 ENCOUNTER — Ambulatory Visit
Admission: RE | Admit: 2017-10-31 | Discharge: 2017-10-31 | Disposition: A | Discharge status: Home / Self Care | Payer: Medicare Other | Source: Ambulatory Visit | Attending: Internal Medicine | Admitting: Internal Medicine

## 2017-10-31 ENCOUNTER — Telehealth: Payer: Self-pay | Admitting: Urology

## 2017-10-31 DIAGNOSIS — R972 Elevated prostate specific antigen [PSA]: Secondary | ICD-10-CM | POA: Diagnosis present

## 2017-10-31 DIAGNOSIS — R1032 Left lower quadrant pain: Secondary | ICD-10-CM | POA: Diagnosis not present

## 2017-10-31 MED ORDER — IOPAMIDOL (ISOVUE-370) INJECTION 76%
100.0000 mL | Freq: Once | INTRAVENOUS | Status: AC | PRN
  Administered 2017-10-31: 100 mL via INTRAVENOUS

## 2017-10-31 NOTE — Telephone Encounter (Signed)
Pt called office asking about Prostate MRI results, Pt isn't happy with not hearing anything and not having Stoioff in office to give results. Pt this is ridiculous and guess he'll wait for call then hung up.

## 2017-10-31 NOTE — Telephone Encounter (Signed)
Pt requesting imaging results, please advise.

## 2017-11-04 NOTE — Telephone Encounter (Signed)
MRI showed nothing suspicious for prostate cancer.  Will call patient when I am back in office to discuss follow up

## 2017-11-04 NOTE — Telephone Encounter (Signed)
Pt informed, will wait for call from Dr. Lonna Cobb

## 2017-11-06 ENCOUNTER — Other Ambulatory Visit: Payer: Self-pay | Admitting: Urology

## 2017-11-06 DIAGNOSIS — R972 Elevated prostate specific antigen [PSA]: Secondary | ICD-10-CM

## 2017-11-07 ENCOUNTER — Telehealth: Payer: Self-pay | Admitting: Radiology

## 2017-11-07 NOTE — Telephone Encounter (Signed)
Notified pt of lab appt. Pt voices understanding. Pt requested reminder letter to be mailed. This was done.

## 2017-11-07 NOTE — Telephone Encounter (Signed)
-----   Message from Riki Altes, MD sent at 11/06/2017  9:13 PM EDT ----- Please schedule lab visit/PSA in 6 months.

## 2017-11-20 IMAGING — CT CT L SPINE W/ CM
1 of 6 series · 5 of 14 positions shown, 7 images · non-contrast
Comparison: CT 07/14/2013

CLINICAL DATA: Low back and buttock pain with some extension to the
left leg.
TECHNIQUE: Contiguous axial images were obtained through the Lumbar spine after
the intrathecal infusion of infusion. Coronal and sagittal
reconstructions were obtained of the axial image sets.

[Series 2: l spine soft · axial · 0.27mm/px · z∈[-324,-156]mm · 5 of 85 slices shown, 7 images]
[im 15/85  soft-tissue]
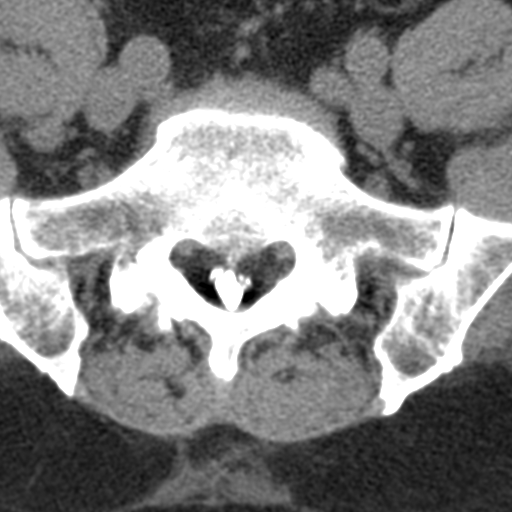
[im 15/85  bone]
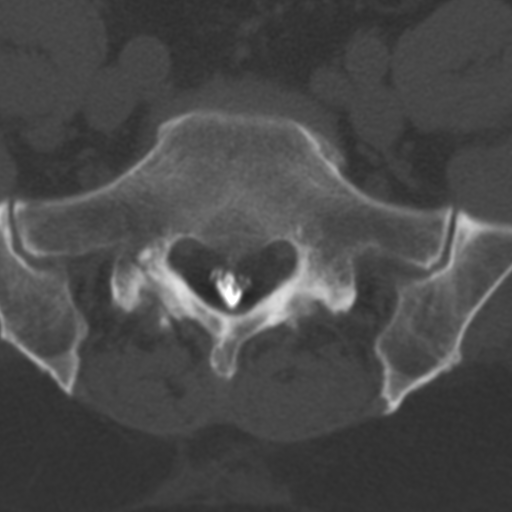
[im 29/85  bone]
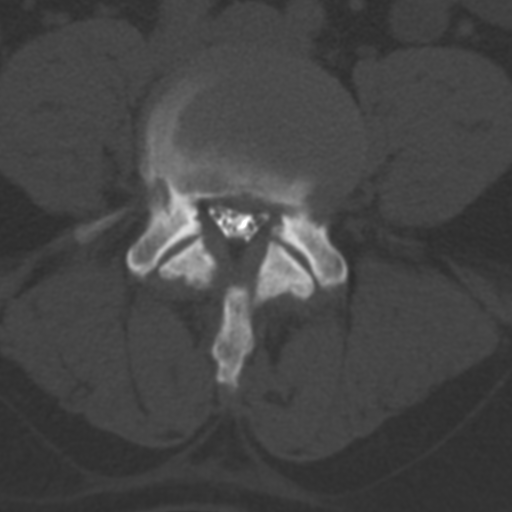
[im 43/85  bone]
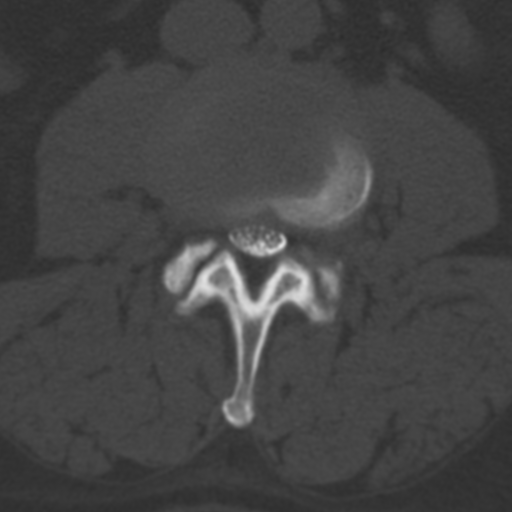
[im 57/85  bone]
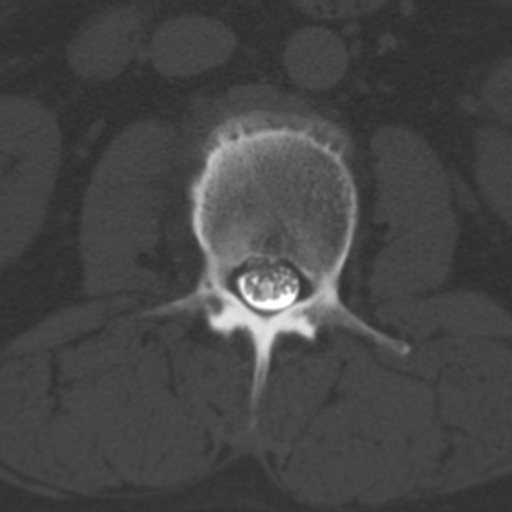
[im 71/85  soft-tissue]
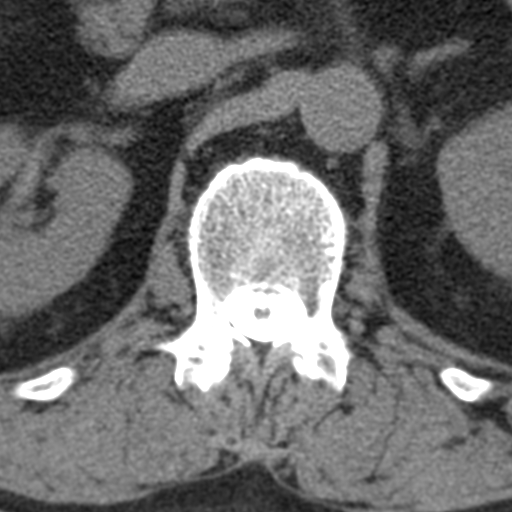
[im 71/85  bone]
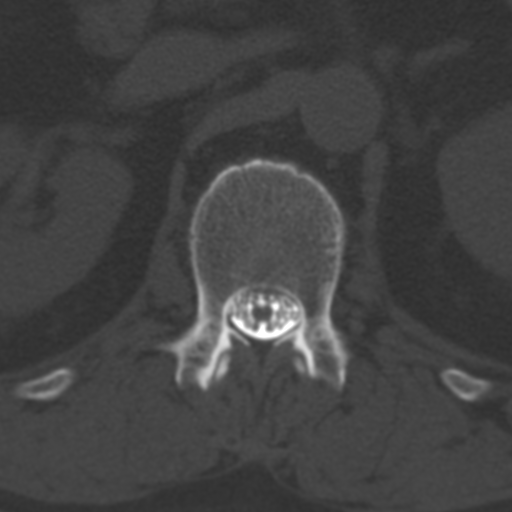

[5 of 14 positions shown; findings below may reference images not displayed]

EXAM:
LUMBAR MYELOGRAM

FLUOROSCOPY TIME:  0 minutes 58 seconds. 264.36 micro gray meter
squared

PROCEDURE:
After thorough discussion of risks and benefits of the procedure
including bleeding, infection, injury to nerves, blood vessels,
adjacent structures as well as headache and CSF leak, written and
oral informed consent was obtained. Consent was obtained by Dr. Difuntorum
Eli. Time out form was completed.

Patient was positioned prone on the fluoroscopy table. Local
anesthesia was provided with 1% lidocaine without epinephrine after
prepped and draped in the usual sterile fashion. Puncture was
performed at L4-5 using a 3 1/2 inch 22-gauge spinal needle via left
para median approach. Using a single pass through the dura, the
needle was placed within the thecal sac, with return of clear CSF.
15 mL of Isovue 200 was injected into the thecal sac, with normal
opacification of the nerve roots and cauda equina consistent with
free flow within the subarachnoid space.

I personally performed the lumbar puncture and administered the
intrathecal contrast. I also personally performed acquisition of the
myelogram images.
FINDINGS: LUMBAR MYELOGRAM FINDINGS:

Mild curvature convex to the left. Mild stenosis of the lateral
recesses at L1-2. Moderate stenosis at L2-3 right worse than left.
Mild stenosis of the lateral recesses L3-4 left worse than right.
Moderate stenosis bilaterally at L4-5. Moderate stenosis bilaterally
at L5-S1. Anterior extradural defects seen on the lateral views. 3
mm anterolisthesis at L5-S1 in the supine position. With standing,
this increases to 9 mm in with flexion to 11 mm.

CT LUMBAR MYELOGRAM FINDINGS:

T11-12: Bilateral facet arthropathy.  No canal stenosis.

T12-L1: Bilateral facet arthropathy. Mild bulging of the disc
towards the left. No compressive stenosis.

L1-2: Circumferential protrusion of the disc with upward migration
of herniated disc material in the midline behind L1. Facet
hypertrophy. Mild narrowing of the lateral recesses but without
distinct neural compression. Conus tip at this level.

L2-3: Disc degeneration with loss of disc height more on the right.
Endplate osteophytes worse on the right. Broad-based protrusion of
disc material with upward migration of herniated disc material in
the right lateral recess behind L2. Stenosis of both lateral
recesses and neural foramina that could cause neural compression,
particularly on the right.

L3-4: Circumferential protrusion of the disc. Bilateral facet and
ligamentous hypertrophy. Mild stenosis of both lateral recesses and
neural foramina.

L4-5: Circumferential protrusion of the disc more prominent towards
the left. Bilateral facet and ligamentous hypertrophy. Stenosis of
both lateral recesses and foramina, left worse than right.

L5-S1: Advanced bilateral facet arthropathy. Anterolisthesis of 4 mm
in this position. Circumferential protrusion of the disc. Narrowing
of both subarticular lateral recesses and both neural foramina that
could cause neural compression. This would worsen with standing
flexion.
IMPRESSION: T11-12 and T12-L1: Facet arthropathy without neural compression.

L1-2: Shallow broad-based disc herniation with upward migration of
disc material in the midline behind L1. Mild facet hypertrophy. Mild
narrowing of the lateral recesses but without neural compression.

L2-3: Disc degeneration more pronounced on the right. Endplate
osteophytes more pronounced on the right. Shallow protrusion of the
disc more prominent towards the right with upward migration behind
L2. Stenosis of the lateral recesses and foramina, right worse than
left. Neural compression could occur at this level, particularly on
the right.

L3-4: Circumferential protrusion of the disc. Bilateral facet
hypertrophy. Mild lateral recess and foraminal narrowing
bilaterally.

L4-5: Shallow protrusion of the disc slightly more towards the left.
Facet and ligamentous hypertrophy. Stenosis of the lateral recesses
and foramina left more than right.

L5-S1: Advanced bilateral facet arthropathy. 4 mm of anterolisthesis
which increases to 9 mm with standing and 11 mm with standing
flexion. Stenosis of the lateral recesses and foramina that could
cause neural compression, the appearance of which would worsen with
standing and flexion.

## 2017-11-20 IMAGING — XA DG MYELOGRAPHY LUMBAR INJ LUMBOSACRAL
6 of 12 series · 6 of 12 positions shown · non-contrast
Comparison: CT 07/14/2013

CLINICAL DATA: Low back and buttock pain with some extension to the
left leg.
TECHNIQUE: Contiguous axial images were obtained through the Lumbar spine after
the intrathecal infusion of infusion. Coronal and sagittal
reconstructions were obtained of the axial image sets.

[Series 1: vasc adipose · 1 of 1 slices shown (1 of 3)]
[im 1/1]
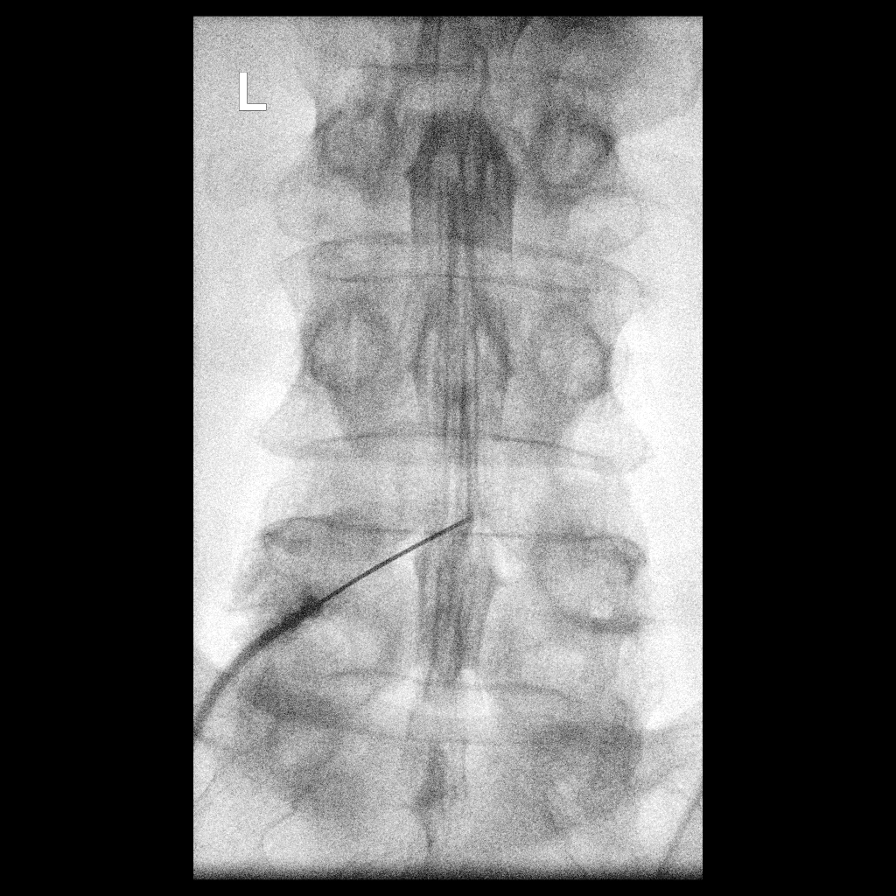

[Series 1: w lumbar spine lat · 0.15mm/px · 1 of 1 slices shown]
[im 1/1]
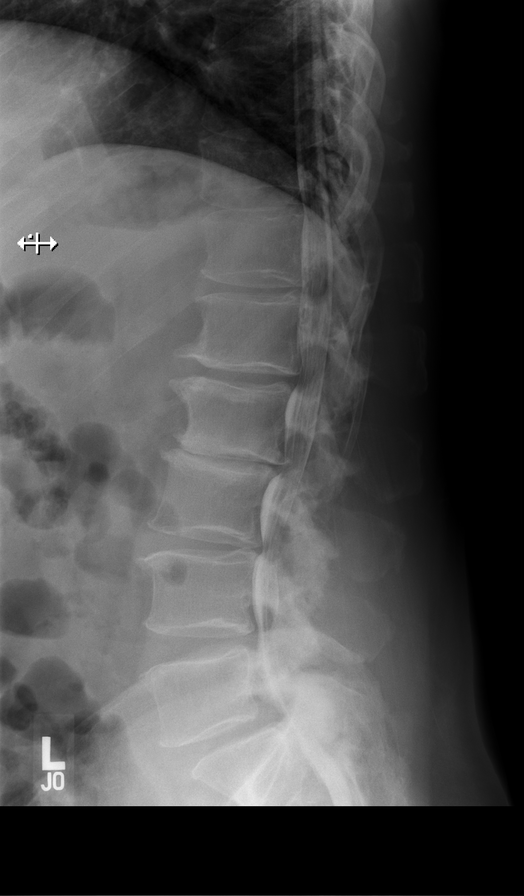

[Series 2: vasc adipose · 1 of 1 slices shown (2 of 3)]
[im 1/1]
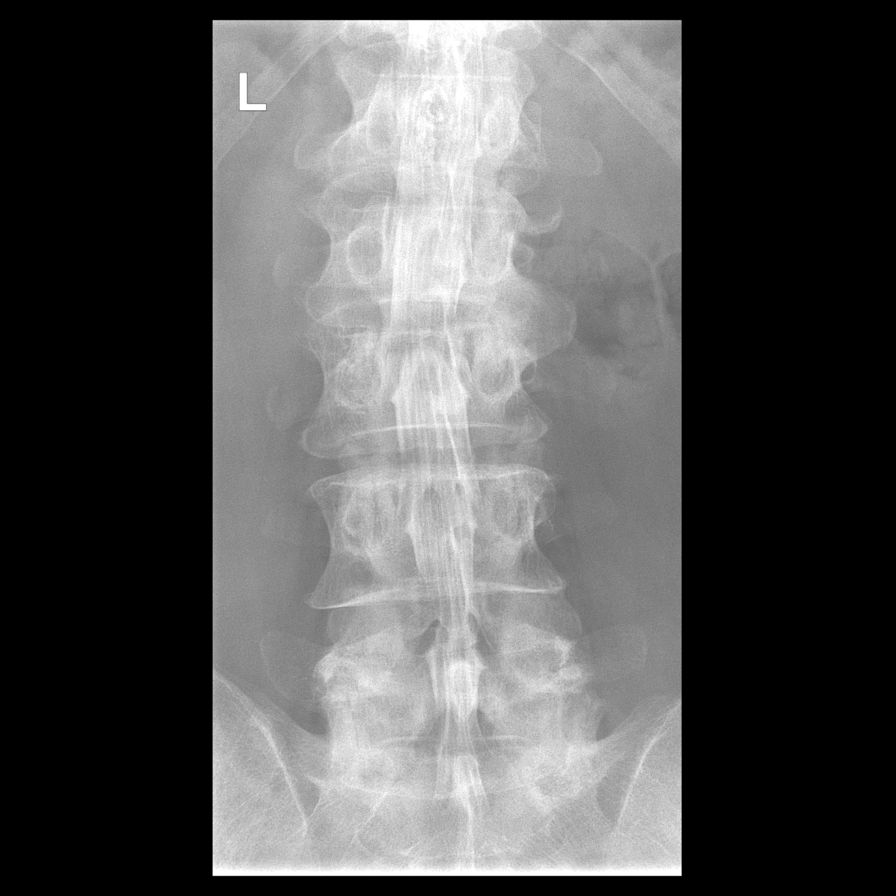

[Series 2: w lumbar spine flexion · 0.15mm/px · 1 of 1 slices shown]
[im 1/1]
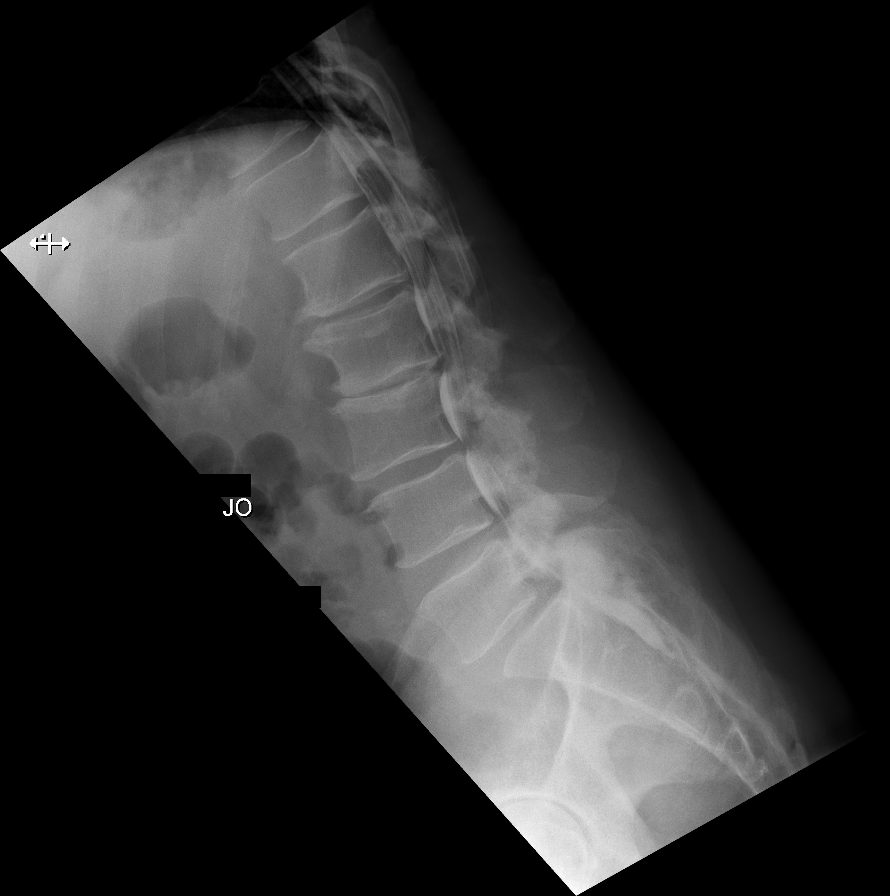

[Series 3: vasc adipose · 1 of 1 slices shown (3 of 3)]
[im 1/1]
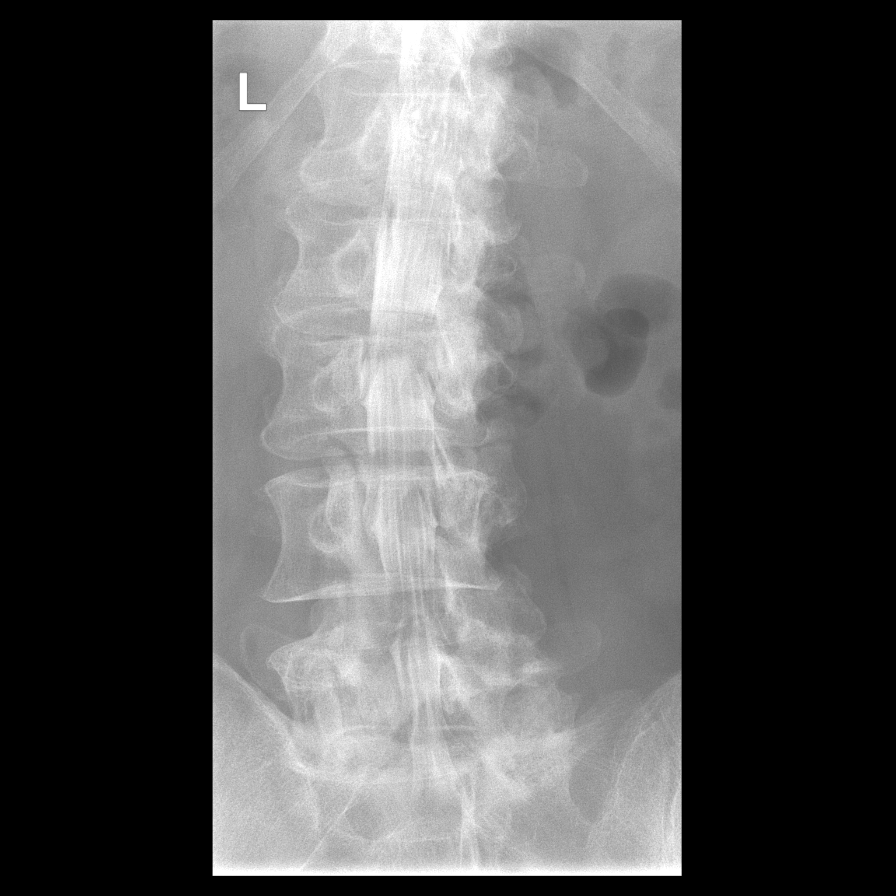

[Series 3: w lumbar spine extension · 0.15mm/px · 1 of 1 slices shown]
[im 1/1]
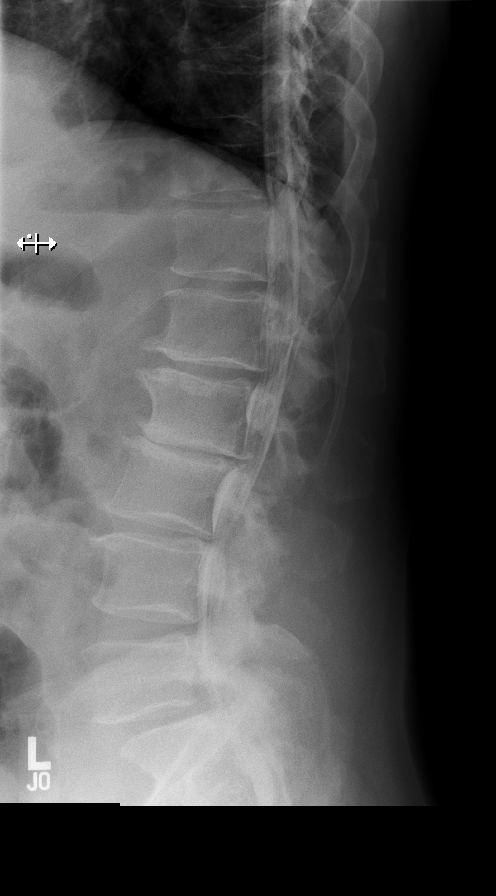

[6 of 12 positions shown; findings below may reference images not displayed]

EXAM:
LUMBAR MYELOGRAM

FLUOROSCOPY TIME:  0 minutes 58 seconds. 264.36 micro gray meter
squared

PROCEDURE:
After thorough discussion of risks and benefits of the procedure
including bleeding, infection, injury to nerves, blood vessels,
adjacent structures as well as headache and CSF leak, written and
oral informed consent was obtained. Consent was obtained by Dr. Difuntorum
Eli. Time out form was completed.

Patient was positioned prone on the fluoroscopy table. Local
anesthesia was provided with 1% lidocaine without epinephrine after
prepped and draped in the usual sterile fashion. Puncture was
performed at L4-5 using a 3 1/2 inch 22-gauge spinal needle via left
para median approach. Using a single pass through the dura, the
needle was placed within the thecal sac, with return of clear CSF.
15 mL of Isovue 200 was injected into the thecal sac, with normal
opacification of the nerve roots and cauda equina consistent with
free flow within the subarachnoid space.

I personally performed the lumbar puncture and administered the
intrathecal contrast. I also personally performed acquisition of the
myelogram images.
FINDINGS: LUMBAR MYELOGRAM FINDINGS:

Mild curvature convex to the left. Mild stenosis of the lateral
recesses at L1-2. Moderate stenosis at L2-3 right worse than left.
Mild stenosis of the lateral recesses L3-4 left worse than right.
Moderate stenosis bilaterally at L4-5. Moderate stenosis bilaterally
at L5-S1. Anterior extradural defects seen on the lateral views. 3
mm anterolisthesis at L5-S1 in the supine position. With standing,
this increases to 9 mm in with flexion to 11 mm.

CT LUMBAR MYELOGRAM FINDINGS:

T11-12: Bilateral facet arthropathy.  No canal stenosis.

T12-L1: Bilateral facet arthropathy. Mild bulging of the disc
towards the left. No compressive stenosis.

L1-2: Circumferential protrusion of the disc with upward migration
of herniated disc material in the midline behind L1. Facet
hypertrophy. Mild narrowing of the lateral recesses but without
distinct neural compression. Conus tip at this level.

L2-3: Disc degeneration with loss of disc height more on the right.
Endplate osteophytes worse on the right. Broad-based protrusion of
disc material with upward migration of herniated disc material in
the right lateral recess behind L2. Stenosis of both lateral
recesses and neural foramina that could cause neural compression,
particularly on the right.

L3-4: Circumferential protrusion of the disc. Bilateral facet and
ligamentous hypertrophy. Mild stenosis of both lateral recesses and
neural foramina.

L4-5: Circumferential protrusion of the disc more prominent towards
the left. Bilateral facet and ligamentous hypertrophy. Stenosis of
both lateral recesses and foramina, left worse than right.

L5-S1: Advanced bilateral facet arthropathy. Anterolisthesis of 4 mm
in this position. Circumferential protrusion of the disc. Narrowing
of both subarticular lateral recesses and both neural foramina that
could cause neural compression. This would worsen with standing
flexion.
IMPRESSION: T11-12 and T12-L1: Facet arthropathy without neural compression.

L1-2: Shallow broad-based disc herniation with upward migration of
disc material in the midline behind L1. Mild facet hypertrophy. Mild
narrowing of the lateral recesses but without neural compression.

L2-3: Disc degeneration more pronounced on the right. Endplate
osteophytes more pronounced on the right. Shallow protrusion of the
disc more prominent towards the right with upward migration behind
L2. Stenosis of the lateral recesses and foramina, right worse than
left. Neural compression could occur at this level, particularly on
the right.

L3-4: Circumferential protrusion of the disc. Bilateral facet
hypertrophy. Mild lateral recess and foraminal narrowing
bilaterally.

L4-5: Shallow protrusion of the disc slightly more towards the left.
Facet and ligamentous hypertrophy. Stenosis of the lateral recesses
and foramina left more than right.

L5-S1: Advanced bilateral facet arthropathy. 4 mm of anterolisthesis
which increases to 9 mm with standing and 11 mm with standing
flexion. Stenosis of the lateral recesses and foramina that could
cause neural compression, the appearance of which would worsen with
standing and flexion.

## 2018-04-08 ENCOUNTER — Encounter: Payer: Self-pay | Admitting: *Deleted

## 2018-04-15 ENCOUNTER — Ambulatory Visit: Payer: Medicare Other | Admitting: Certified Registered Nurse Anesthetist

## 2018-04-15 ENCOUNTER — Other Ambulatory Visit: Payer: Self-pay

## 2018-04-15 ENCOUNTER — Encounter: Payer: Self-pay | Admitting: *Deleted

## 2018-04-15 ENCOUNTER — Ambulatory Visit
Admission: RE | Admit: 2018-04-15 | Discharge: 2018-04-15 | Disposition: A | Discharge status: Home / Self Care | Payer: Medicare Other | Source: Ambulatory Visit | Attending: Ophthalmology | Admitting: Ophthalmology

## 2018-04-15 ENCOUNTER — Encounter
Admission: RE | Disposition: A | Discharge status: Home / Self Care | Payer: Self-pay | Source: Ambulatory Visit | Attending: Ophthalmology

## 2018-04-15 DIAGNOSIS — Z87891 Personal history of nicotine dependence: Secondary | ICD-10-CM | POA: Diagnosis not present

## 2018-04-15 DIAGNOSIS — H2511 Age-related nuclear cataract, right eye: Secondary | ICD-10-CM | POA: Diagnosis present

## 2018-04-15 DIAGNOSIS — Z79899 Other long term (current) drug therapy: Secondary | ICD-10-CM | POA: Insufficient documentation

## 2018-04-15 DIAGNOSIS — Z96653 Presence of artificial knee joint, bilateral: Secondary | ICD-10-CM | POA: Insufficient documentation

## 2018-04-15 DIAGNOSIS — K219 Gastro-esophageal reflux disease without esophagitis: Secondary | ICD-10-CM | POA: Diagnosis not present

## 2018-04-15 HISTORY — DX: Gastro-esophageal reflux disease without esophagitis: K21.9

## 2018-04-15 HISTORY — DX: Elevated prostate specific antigen (PSA): R97.20

## 2018-04-15 HISTORY — DX: Tinnitus, unspecified ear: H93.19

## 2018-04-15 HISTORY — DX: Tremor, unspecified: R25.1

## 2018-04-15 HISTORY — PX: CATARACT EXTRACTION W/PHACO: SHX586

## 2018-04-15 SURGERY — PHACOEMULSIFICATION, CATARACT, WITH IOL INSERTION
Anesthesia: Monitor Anesthesia Care | Site: Eye | Laterality: Right

## 2018-04-15 MED ORDER — NA CHONDROIT SULF-NA HYALURON 40-17 MG/ML IO SOLN
INTRAOCULAR | Status: AC
  Filled 2018-04-15: qty 1

## 2018-04-15 MED ORDER — POVIDONE-IODINE 5 % OP SOLN
OPHTHALMIC | Status: AC
  Filled 2018-04-15: qty 30

## 2018-04-15 MED ORDER — CARBACHOL 0.01 % IO SOLN
INTRAOCULAR | Status: DC | PRN
  Administered 2018-04-15: .5 mL via INTRAOCULAR

## 2018-04-15 MED ORDER — ARMC OPHTHALMIC DILATING DROPS
1.0000 "application " | OPHTHALMIC | Status: AC
  Administered 2018-04-15 (×3): 1 via OPHTHALMIC

## 2018-04-15 MED ORDER — ARMC OPHTHALMIC DILATING DROPS
OPHTHALMIC | Status: AC
  Filled 2018-04-15: qty 0.5

## 2018-04-15 MED ORDER — MIDAZOLAM HCL 2 MG/2ML IJ SOLN
INTRAMUSCULAR | Status: AC
  Filled 2018-04-15: qty 2

## 2018-04-15 MED ORDER — POVIDONE-IODINE 5 % OP SOLN
OPHTHALMIC | Status: DC | PRN
  Administered 2018-04-15: 1 via OPHTHALMIC

## 2018-04-15 MED ORDER — LIDOCAINE HCL (PF) 4 % IJ SOLN
INTRAMUSCULAR | Status: AC
  Filled 2018-04-15: qty 5

## 2018-04-15 MED ORDER — TETRACAINE HCL 0.5 % OP SOLN
1.0000 [drp] | OPHTHALMIC | Status: AC | PRN
  Administered 2018-04-15 (×3): 1 [drp] via OPHTHALMIC

## 2018-04-15 MED ORDER — EPINEPHRINE PF 1 MG/ML IJ SOLN
INTRAOCULAR | Status: DC | PRN
  Administered 2018-04-15: 1 mL via OPHTHALMIC

## 2018-04-15 MED ORDER — MIDAZOLAM HCL 2 MG/2ML IJ SOLN
INTRAMUSCULAR | Status: DC | PRN
  Administered 2018-04-15 (×2): 1 mg via INTRAVENOUS

## 2018-04-15 MED ORDER — SODIUM CHLORIDE 0.9 % IV SOLN
INTRAVENOUS | Status: DC
  Administered 2018-04-15: 08:00:00 via INTRAVENOUS

## 2018-04-15 MED ORDER — LIDOCAINE HCL (PF) 4 % IJ SOLN
INTRAOCULAR | Status: DC | PRN
  Administered 2018-04-15: 2 mL via OPHTHALMIC

## 2018-04-15 MED ORDER — MOXIFLOXACIN HCL 0.5 % OP SOLN
OPHTHALMIC | Status: AC
  Filled 2018-04-15: qty 3

## 2018-04-15 MED ORDER — NA CHONDROIT SULF-NA HYALURON 40-17 MG/ML IO SOLN
INTRAOCULAR | Status: DC | PRN
  Administered 2018-04-15: 1 mL via INTRAOCULAR

## 2018-04-15 MED ORDER — MOXIFLOXACIN HCL 0.5 % OP SOLN
OPHTHALMIC | Status: DC | PRN
  Administered 2018-04-15: .2 mL via OPHTHALMIC

## 2018-04-15 MED ORDER — MOXIFLOXACIN HCL 0.5 % OP SOLN: 1.0000 [drp] | OPHTHALMIC | Status: DC | PRN

## 2018-04-15 MED ORDER — TETRACAINE HCL 0.5 % OP SOLN
OPHTHALMIC | Status: AC
  Filled 2018-04-15: qty 4

## 2018-04-15 MED ORDER — EPINEPHRINE PF 1 MG/ML IJ SOLN
INTRAMUSCULAR | Status: AC
  Filled 2018-04-15: qty 1

## 2018-04-15 SURGICAL SUPPLY — 16 items
GLOVE BIO SURGEON STRL SZ8 (GLOVE) ×3 IMPLANT
GLOVE BIOGEL M 6.5 STRL (GLOVE) ×3 IMPLANT
GLOVE SURG LX 8.0 MICRO (GLOVE) ×2
GLOVE SURG LX STRL 8.0 MICRO (GLOVE) ×1 IMPLANT
GOWN STRL REUS W/ TWL LRG LVL3 (GOWN DISPOSABLE) ×2 IMPLANT
GOWN STRL REUS W/TWL LRG LVL3 (GOWN DISPOSABLE) ×4
LABEL CATARACT MEDS ST (LABEL) ×3 IMPLANT
LENS IOL TECNIS ITEC 17.5 (Intraocular Lens) ×3 IMPLANT
PACK CATARACT (MISCELLANEOUS) ×3 IMPLANT
PACK CATARACT BRASINGTON LX (MISCELLANEOUS) ×3 IMPLANT
PACK EYE AFTER SURG (MISCELLANEOUS) ×3 IMPLANT
SOL BSS BAG (MISCELLANEOUS) ×3
SOLUTION BSS BAG (MISCELLANEOUS) ×1 IMPLANT
SYR 5ML LL (SYRINGE) ×3 IMPLANT
WATER STERILE IRR 250ML POUR (IV SOLUTION) ×3 IMPLANT
WIPE NON LINTING 3.25X3.25 (MISCELLANEOUS) ×3 IMPLANT

## 2018-04-15 NOTE — Anesthesia Postprocedure Evaluation (Signed)
Anesthesia Post Note  Patient: Keith Fuller  Procedure(s) Performed: CATARACT EXTRACTION PHACO AND INTRAOCULAR LENS PLACEMENT (IOC) (Right Eye)  Patient location during evaluation: Short Stay Anesthesia Type: MAC Level of consciousness: awake and alert, oriented and patient cooperative Pain management: satisfactory to patient Vital Signs Assessment: post-procedure vital signs reviewed and stable Respiratory status: spontaneous breathing, respiratory function stable and nonlabored ventilation Cardiovascular status: blood pressure returned to baseline and stable Postop Assessment: no headache and no apparent nausea or vomiting Anesthetic complications: no     Last Vitals:  Vitals:   04/15/18 0738 04/15/18 0900  BP: 115/81 121/70  Pulse: 66 64  Resp: 14 16  Temp: 36.6 C 36.6 C  SpO2: 96% 99%    Last Pain:  Vitals:   04/15/18 0900  TempSrc:   PainSc: 0-No pain                 Eben Burow

## 2018-04-15 NOTE — Transfer of Care (Signed)
Immediate Anesthesia Transfer of Care Note  Patient: Keith Fuller  Procedure(s) Performed: CATARACT EXTRACTION PHACO AND INTRAOCULAR LENS PLACEMENT (IOC) (Right Eye)  Patient Location: Short Stay  Anesthesia Type:MAC  Level of Consciousness: awake, alert , oriented and patient cooperative  Airway & Oxygen Therapy: Patient Spontanous Breathing  Post-op Assessment: Report given to RN and Post -op Vital signs reviewed and stable  Post vital signs: Reviewed and stable  Last Vitals:  Vitals Value Taken Time  BP    Temp    Pulse    Resp    SpO2      Last Pain:  Vitals:   04/15/18 0738  TempSrc: Oral  PainSc: 3          Complications: No apparent anesthesia complications

## 2018-04-15 NOTE — Discharge Instructions (Signed)
Eye Surgery Discharge Instructions    Expect mild scratchy sensation or mild soreness. DO NOT RUB YOUR EYE!  The day of surgery:  Minimal physical activity, but bed rest is not required  No reading, computer work, or close hand work  No bending, lifting, or straining.  May watch TV  For 24 hours:  No driving, legal decisions, or alcoholic beverages  Safety precautions  Eat anything you prefer: It is better to start with liquids, then soup then solid foods.  _____ Eye patch should be worn until postoperative exam tomorrow.  ____ Solar shield eyeglasses should be worn for comfort in the sunlight/patch while sleeping  Resume all regular medications including aspirin or Coumadin if these were discontinued prior to surgery. You may shower, bathe, shave, or wash your hair. Tylenol may be taken for mild discomfort.  Call your doctor if you experience significant pain, nausea, or vomiting, fever > 101 or other signs of infection. [phone] or [phone] Specific instructions:  Follow-up Information    Galen Manila, MD Follow up.   Specialty:  Ophthalmology Why:  October 16 at 9:35am Contact information: [address] [place] [address] [phone]

## 2018-04-15 NOTE — Anesthesia Preprocedure Evaluation (Signed)
Anesthesia Evaluation  Patient identified by MRN, date of birth, ID band Patient awake    Reviewed: Allergy & Precautions, NPO status , Patient's Chart, lab work & pertinent test results  History of Anesthesia Complications Negative for: history of anesthetic complications  Airway Mallampati: III  TM Distance: >3 FB Neck ROM: Full    Dental  (+) Poor Dentition, Implants   Pulmonary neg sleep apnea, neg COPD, former smoker,    breath sounds clear to auscultation- rhonchi (-) wheezing      Cardiovascular Exercise Tolerance: Good (-) hypertension(-) CAD, (-) Past MI, (-) Cardiac Stents and (-) CABG  Rhythm:Regular Rate:Normal - Systolic murmurs and - Diastolic murmurs    Neuro/Psych negative neurological ROS  negative psych ROS   GI/Hepatic GERD  ,(+) Hepatitis - (s/p treatment), C  Endo/Other  negative endocrine ROSneg diabetes  Renal/GU negative Renal ROS     Musculoskeletal  (+) Arthritis ,   Abdominal (+) + obese,   Peds  Hematology negative hematology ROS (+)   Anesthesia Other Findings Past Medical History: No date: Allergy No date: Arthritis No date: GERD (gastroesophageal reflux disease) No date: Hepatitis C     Comment:  denies any virus in system now No date: PSA elevation No date: Tinnitus No date: Tremors of nervous system     Comment:  HANDS   Reproductive/Obstetrics                             Anesthesia Physical Anesthesia Plan  ASA: II  Anesthesia Plan: MAC   Post-op Pain Management:    Induction: Intravenous  PONV Risk Score and Plan: 1 and Midazolam  Airway Management Planned: Natural Airway  Additional Equipment:   Intra-op Plan:   Post-operative Plan:   Informed Consent: I have reviewed the patients History and Physical, chart, labs and discussed the procedure including the risks, benefits and alternatives for the proposed anesthesia with the patient  or authorized representative who has indicated his/her understanding and acceptance.     Plan Discussed with: CRNA and Anesthesiologist  Anesthesia Plan Comments:         Anesthesia Quick Evaluation

## 2018-04-15 NOTE — H&P (Signed)
All labs reviewed. Abnormal studies sent to patients PCP when indicated.  Previous H&P reviewed, patient examined, there are NO CHANGES.  Keith Mcauley Porfilio10/15/20198:42 AM

## 2018-04-15 NOTE — Op Note (Signed)
PREOPERATIVE DIAGNOSIS:  Nuclear sclerotic cataract of the right eye.   POSTOPERATIVE DIAGNOSIS:  nuclear sclerotic cataract right eye   OPERATIVE PROCEDURE: Procedure(s): CATARACT EXTRACTION PHACO AND INTRAOCULAR LENS PLACEMENT (IOC)   SURGEON:  Galen Manila, MD.   ANESTHESIA:  Anesthesiologist: Alver Fisher, MD CRNA: Dava Najjar, CRNA  1.      Managed anesthesia care. 2.      0.42ml of Shugarcaine was instilled in the eye following the paracentesis.   COMPLICATIONS:  None.   TECHNIQUE:   Stop and chop   DESCRIPTION OF PROCEDURE:  The patient was examined and consented in the preoperative holding area where the aforementioned topical anesthesia was applied to the right eye and then brought back to the Operating Room where the right eye was prepped and draped in the usual sterile ophthalmic fashion and a lid speculum was placed. A paracentesis was created with the side port blade and the anterior chamber was filled with viscoelastic. A near clear corneal incision was performed with the steel keratome. A continuous curvilinear capsulorrhexis was performed with a cystotome followed by the capsulorrhexis forceps. Hydrodissection and hydrodelineation were carried out with BSS on a blunt cannula. The lens was removed in a stop and chop  technique and the remaining cortical material was removed with the irrigation-aspiration handpiece. The capsular bag was inflated with viscoelastic and the Technis ZCB00  lens was placed in the capsular bag without complication. The remaining viscoelastic was removed from the eye with the irrigation-aspiration handpiece. The wounds were hydrated. The anterior chamber was flushed with Miostat and the eye was inflated to physiologic pressure. 0.63ml of Vigamox was placed in the anterior chamber. The wounds were found to be water tight. The eye was dressed with Vigamox. The patient was given protective glasses to wear throughout the day and a shield with which to  sleep tonight. The patient was also given drops with which to begin a drop regimen today and will follow-up with me in one day. Implant Name Type Inv. Item Serial No. Manufacturer Lot No. LRB No. Used  LENS IOL DIOP 17.5 - [id number] Intraocular Lens LENS IOL DIOP 17.5 [id number] AMO  Right 1   Procedure(s) with comments: CATARACT EXTRACTION PHACO AND INTRAOCULAR LENS PLACEMENT (IOC) (Right) - [place] 00:28 CDE 4.57 Fluid pack lot # [id number]  Electronically signed: Galen Manila 04/15/2018 9:02 AM

## 2018-04-15 NOTE — OR Nursing (Signed)
disharge instructions discussed with pt and family. Both voice understanding.

## 2018-04-15 NOTE — Anesthesia Post-op Follow-up Note (Signed)
Anesthesia QCDR form completed.        

## 2018-05-07 ENCOUNTER — Encounter: Payer: Self-pay | Admitting: *Deleted

## 2018-05-12 ENCOUNTER — Other Ambulatory Visit: Payer: Medicare Other

## 2018-05-12 DIAGNOSIS — R972 Elevated prostate specific antigen [PSA]: Secondary | ICD-10-CM

## 2018-05-13 ENCOUNTER — Ambulatory Visit
Admission: RE | Admit: 2018-05-13 | Discharge: 2018-05-13 | Disposition: A | Discharge status: Home / Self Care | Payer: Medicare Other | Source: Ambulatory Visit | Attending: Ophthalmology | Admitting: Ophthalmology

## 2018-05-13 ENCOUNTER — Other Ambulatory Visit: Payer: Self-pay

## 2018-05-13 ENCOUNTER — Encounter
Admission: RE | Disposition: A | Discharge status: Home / Self Care | Payer: Self-pay | Source: Ambulatory Visit | Attending: Ophthalmology

## 2018-05-13 ENCOUNTER — Encounter: Payer: Self-pay | Admitting: Anesthesiology

## 2018-05-13 ENCOUNTER — Other Ambulatory Visit: Payer: Medicare Other

## 2018-05-13 ENCOUNTER — Ambulatory Visit: Payer: Medicare Other | Admitting: Anesthesiology

## 2018-05-13 DIAGNOSIS — Z9841 Cataract extraction status, right eye: Secondary | ICD-10-CM | POA: Diagnosis not present

## 2018-05-13 DIAGNOSIS — H2512 Age-related nuclear cataract, left eye: Secondary | ICD-10-CM | POA: Diagnosis present

## 2018-05-13 DIAGNOSIS — Z96653 Presence of artificial knee joint, bilateral: Secondary | ICD-10-CM | POA: Insufficient documentation

## 2018-05-13 DIAGNOSIS — Z79899 Other long term (current) drug therapy: Secondary | ICD-10-CM | POA: Insufficient documentation

## 2018-05-13 DIAGNOSIS — M1990 Unspecified osteoarthritis, unspecified site: Secondary | ICD-10-CM | POA: Insufficient documentation

## 2018-05-13 DIAGNOSIS — E669 Obesity, unspecified: Secondary | ICD-10-CM | POA: Diagnosis not present

## 2018-05-13 DIAGNOSIS — B1920 Unspecified viral hepatitis C without hepatic coma: Secondary | ICD-10-CM | POA: Insufficient documentation

## 2018-05-13 DIAGNOSIS — Z87891 Personal history of nicotine dependence: Secondary | ICD-10-CM | POA: Insufficient documentation

## 2018-05-13 DIAGNOSIS — K219 Gastro-esophageal reflux disease without esophagitis: Secondary | ICD-10-CM | POA: Insufficient documentation

## 2018-05-13 DIAGNOSIS — Z880 Allergy status to penicillin: Secondary | ICD-10-CM | POA: Diagnosis not present

## 2018-05-13 DIAGNOSIS — Z6832 Body mass index (BMI) 32.0-32.9, adult: Secondary | ICD-10-CM | POA: Diagnosis not present

## 2018-05-13 HISTORY — PX: CATARACT EXTRACTION W/PHACO: SHX586

## 2018-05-13 LAB — PSA: PROSTATE SPECIFIC AG, SERUM: 25.5 ng/mL — AB (ref 0.0–4.0)

## 2018-05-13 SURGERY — PHACOEMULSIFICATION, CATARACT, WITH IOL INSERTION
Anesthesia: Monitor Anesthesia Care | Site: Eye | Laterality: Left

## 2018-05-13 MED ORDER — CARBACHOL 0.01 % IO SOLN
INTRAOCULAR | Status: DC | PRN
  Administered 2018-05-13: 0.5 mL via INTRAOCULAR

## 2018-05-13 MED ORDER — TETRACAINE HCL 0.5 % OP SOLN
1.0000 [drp] | OPHTHALMIC | Status: DC | PRN
  Administered 2018-05-13: 1 [drp] via OPHTHALMIC

## 2018-05-13 MED ORDER — POVIDONE-IODINE 5 % OP SOLN
OPHTHALMIC | Status: DC | PRN
  Administered 2018-05-13: 1 via OPHTHALMIC

## 2018-05-13 MED ORDER — POVIDONE-IODINE 5 % OP SOLN
OPHTHALMIC | Status: AC
  Filled 2018-05-13: qty 30

## 2018-05-13 MED ORDER — MOXIFLOXACIN HCL 0.5 % OP SOLN: 1.0000 [drp] | OPHTHALMIC | Status: DC | PRN

## 2018-05-13 MED ORDER — MIDAZOLAM HCL 2 MG/2ML IJ SOLN
INTRAMUSCULAR | Status: AC
  Filled 2018-05-13: qty 2

## 2018-05-13 MED ORDER — MOXIFLOXACIN HCL 0.5 % OP SOLN
OPHTHALMIC | Status: DC | PRN
  Administered 2018-05-13: 0.2 mL via OPHTHALMIC

## 2018-05-13 MED ORDER — LIDOCAINE HCL (PF) 4 % IJ SOLN
INTRAMUSCULAR | Status: AC
  Filled 2018-05-13: qty 5

## 2018-05-13 MED ORDER — EPINEPHRINE PF 1 MG/ML IJ SOLN
INTRAOCULAR | Status: DC | PRN
  Administered 2018-05-13: 13:00:00 via OPHTHALMIC

## 2018-05-13 MED ORDER — ARMC OPHTHALMIC DILATING DROPS
1.0000 "application " | OPHTHALMIC | Status: AC
  Administered 2018-05-13 (×3): 1 via OPHTHALMIC

## 2018-05-13 MED ORDER — EPINEPHRINE PF 1 MG/ML IJ SOLN
INTRAMUSCULAR | Status: AC
  Filled 2018-05-13: qty 1

## 2018-05-13 MED ORDER — MIDAZOLAM HCL 2 MG/2ML IJ SOLN
INTRAMUSCULAR | Status: DC | PRN
  Administered 2018-05-13 (×2): 1 mg via INTRAVENOUS

## 2018-05-13 MED ORDER — NA CHONDROIT SULF-NA HYALURON 40-17 MG/ML IO SOLN
INTRAOCULAR | Status: AC
  Filled 2018-05-13: qty 1

## 2018-05-13 MED ORDER — NA CHONDROIT SULF-NA HYALURON 40-17 MG/ML IO SOLN
INTRAOCULAR | Status: DC | PRN
  Administered 2018-05-13: 1 mL via INTRAOCULAR

## 2018-05-13 MED ORDER — LIDOCAINE HCL (PF) 4 % IJ SOLN
INTRAOCULAR | Status: DC | PRN
  Administered 2018-05-13: 4 mL via OPHTHALMIC

## 2018-05-13 MED ORDER — MOXIFLOXACIN HCL 0.5 % OP SOLN
OPHTHALMIC | Status: AC
  Filled 2018-05-13: qty 3

## 2018-05-13 MED ORDER — SODIUM CHLORIDE 0.9 % IV SOLN
INTRAVENOUS | Status: DC
  Administered 2018-05-13: 13:00:00 via INTRAVENOUS

## 2018-05-13 MED ORDER — TETRACAINE HCL 0.5 % OP SOLN
OPHTHALMIC | Status: AC
  Administered 2018-05-13: 1 [drp] via OPHTHALMIC
  Filled 2018-05-13: qty 4

## 2018-05-13 MED ORDER — ARMC OPHTHALMIC DILATING DROPS
OPHTHALMIC | Status: AC
  Administered 2018-05-13: 1 via OPHTHALMIC
  Filled 2018-05-13: qty 0.5

## 2018-05-13 SURGICAL SUPPLY — 16 items
GLOVE BIO SURGEON STRL SZ8 (GLOVE) ×3 IMPLANT
GLOVE BIOGEL M 6.5 STRL (GLOVE) ×3 IMPLANT
GLOVE SURG LX 8.0 MICRO (GLOVE) ×2
GLOVE SURG LX STRL 8.0 MICRO (GLOVE) ×1 IMPLANT
GOWN STRL REUS W/ TWL LRG LVL3 (GOWN DISPOSABLE) ×2 IMPLANT
GOWN STRL REUS W/TWL LRG LVL3 (GOWN DISPOSABLE) ×4
LABEL CATARACT MEDS ST (LABEL) ×3 IMPLANT
LENS IOL TECNIS ITEC 18.5 (Intraocular Lens) ×2 IMPLANT
PACK CATARACT (MISCELLANEOUS) ×3 IMPLANT
PACK CATARACT BRASINGTON LX (MISCELLANEOUS) ×3 IMPLANT
PACK EYE AFTER SURG (MISCELLANEOUS) ×3 IMPLANT
SOL BSS BAG (MISCELLANEOUS) ×3
SOLUTION BSS BAG (MISCELLANEOUS) ×1 IMPLANT
SYR 5ML LL (SYRINGE) ×3 IMPLANT
WATER STERILE IRR 250ML POUR (IV SOLUTION) ×3 IMPLANT
WIPE NON LINTING 3.25X3.25 (MISCELLANEOUS) ×3 IMPLANT

## 2018-05-13 NOTE — Transfer of Care (Signed)
Immediate Anesthesia Transfer of Care Note  Patient: Keith Fuller  Procedure(s) Performed: CATARACT EXTRACTION PHACO AND INTRAOCULAR LENS PLACEMENT ([place]) (Left Eye)  Patient Location: PACU  Anesthesia Type:MAC  Level of Consciousness: awake, alert  and oriented  Airway & Oxygen Therapy: Patient Spontanous Breathing  Post-op Assessment: Report given to RN and Post -op Vital signs reviewed and stable  Post vital signs: Reviewed and stable  Last Vitals:  Vitals Value Taken Time  BP 134/77 05/13/2018  1:01 PM  Temp 36.2 C 05/13/2018  1:01 PM  Pulse 63 05/13/2018  1:01 PM  Resp 17 05/13/2018  1:01 PM  SpO2 100 % 05/13/2018  1:01 PM    Last Pain:  Vitals:   05/13/18 1301  TempSrc: Temporal  PainSc: 0-No pain         Complications: No apparent anesthesia complications

## 2018-05-13 NOTE — H&P (Signed)
All labs reviewed. Abnormal studies sent to patients PCP when indicated.  Previous H&P reviewed, patient examined, there are NO CHANGES.  Keith Noa Porfilio11/12/201912:37 PM

## 2018-05-13 NOTE — Anesthesia Postprocedure Evaluation (Signed)
Anesthesia Post Note  Patient: Keith Fuller  Procedure(s) Performed: CATARACT EXTRACTION PHACO AND INTRAOCULAR LENS PLACEMENT (IOC) (Left Eye)  Patient location during evaluation: PACU Anesthesia Type: MAC Level of consciousness: awake and alert Pain management: pain level controlled Vital Signs Assessment: post-procedure vital signs reviewed and stable Respiratory status: spontaneous breathing, nonlabored ventilation, respiratory function stable and patient connected to nasal cannula oxygen Cardiovascular status: stable and blood pressure returned to baseline Postop Assessment: no apparent nausea or vomiting Anesthetic complications: no     Last Vitals:  Vitals:   05/13/18 1146 05/13/18 1301  BP: (!) 130/94 134/77  Pulse: 62 63  Resp: 16 17  Temp: (!) 36.2 C (!) 36.2 C  SpO2: 96% 100%    Last Pain:  Vitals:   05/13/18 1301  TempSrc: Temporal  PainSc: 0-No pain                 Shourya Macpherson S

## 2018-05-13 NOTE — Anesthesia Preprocedure Evaluation (Signed)
Anesthesia Evaluation  Patient identified by MRN, date of birth, ID band Patient awake    Reviewed: Allergy & Precautions, NPO status , Patient's Chart, lab work & pertinent test results, reviewed documented beta blocker date and time   Airway Mallampati: III  TM Distance: >3 FB     Dental  (+) Chipped   Pulmonary former smoker,           Cardiovascular      Neuro/Psych  Neuromuscular disease    GI/Hepatic GERD  Controlled,(+) Hepatitis -  Endo/Other    Renal/GU      Musculoskeletal  (+) Arthritis ,   Abdominal   Peds  Hematology   Anesthesia Other Findings Obese. EKG ok 3 yrs.  Reproductive/Obstetrics                             Anesthesia Physical Anesthesia Plan  ASA: III  Anesthesia Plan: MAC   Post-op Pain Management:    Induction:   PONV Risk Score and Plan:   Airway Management Planned:   Additional Equipment:   Intra-op Plan:   Post-operative Plan:   Informed Consent: I have reviewed the patients History and Physical, chart, labs and discussed the procedure including the risks, benefits and alternatives for the proposed anesthesia with the patient or authorized representative who has indicated his/her understanding and acceptance.     Plan Discussed with: CRNA  Anesthesia Plan Comments:         Anesthesia Quick Evaluation

## 2018-05-13 NOTE — Anesthesia Post-op Follow-up Note (Signed)
Anesthesia QCDR form completed.        

## 2018-05-13 NOTE — Anesthesia Procedure Notes (Signed)
Procedure Name: MAC Date/Time: 05/13/2018 12:44 PM Performed by: Johnna Acosta, CRNA Pre-anesthesia Checklist: Patient identified, Emergency Drugs available, Suction available, Patient being monitored and Timeout performed Patient Re-evaluated:Patient Re-evaluated prior to induction Oxygen Delivery Method: Nasal cannula

## 2018-05-13 NOTE — Discharge Instructions (Signed)
Eye Surgery Discharge Instructions    Expect mild scratchy sensation or mild soreness. DO NOT RUB YOUR EYE!  The day of surgery:  Minimal physical activity, but bed rest is not required  No reading, computer work, or close hand work  No bending, lifting, or straining.  May watch TV  For 24 hours:  No driving, legal decisions, or alcoholic beverages  Safety precautions  Eat anything you prefer: It is better to start with liquids, then soup then solid foods.  _____ Eye patch should be worn until postoperative exam tomorrow.  ____ Solar shield eyeglasses should be worn for comfort in the sunlight/patch while sleeping  Resume all regular medications including aspirin or Coumadin if these were discontinued prior to surgery. You may shower, bathe, shave, or wash your hair. Tylenol may be taken for mild discomfort.  Call your doctor if you experience significant pain, nausea, or vomiting, fever > 101 or other signs of infection. [phone] or [phone] Specific instructions:  Follow-up Information    Galen ManilaPorfilio, William, MD Follow up on 05/14/2018.   Specialty:  Ophthalmology Why:  appointment time @ 10:25 AM Contact information: [address] [place] [address] [phone]

## 2018-05-13 NOTE — Op Note (Signed)
PREOPERATIVE DIAGNOSIS:  Nuclear sclerotic cataract of the left eye.   POSTOPERATIVE DIAGNOSIS:  Nuclear sclerotic cataract of the left eye.   OPERATIVE PROCEDURE: Procedure(s): CATARACT EXTRACTION PHACO AND INTRAOCULAR LENS PLACEMENT (IOC)   SURGEON:  Galen ManilaWilliam Niana Martorana, MD.   ANESTHESIA:  Anesthesiologist: Berdine Addisonhomas, Mathai, MD CRNA: Ginger CarneMichelet, Stephanie, CRNA  1.      Managed anesthesia care. 2.     0.21ml of Shugarcaine was instilled following the paracentesis   COMPLICATIONS:  None.   TECHNIQUE:   Stop and chop   DESCRIPTION OF PROCEDURE:  The patient was examined and consented in the preoperative holding area where the aforementioned topical anesthesia was applied to the left eye and then brought back to the Operating Room where the left eye was prepped and draped in the usual sterile ophthalmic fashion and a lid speculum was placed. A paracentesis was created with the side port blade and the anterior chamber was filled with viscoelastic. A near clear corneal incision was performed with the steel keratome. A continuous curvilinear capsulorrhexis was performed with a cystotome followed by the capsulorrhexis forceps. Hydrodissection and hydrodelineation were carried out with BSS on a blunt cannula. The lens was removed in a stop and chop  technique and the remaining cortical material was removed with the irrigation-aspiration handpiece. The capsular bag was inflated with viscoelastic and the Technis ZCB00 lens was placed in the capsular bag without complication. The remaining viscoelastic was removed from the eye with the irrigation-aspiration handpiece. The wounds were hydrated. The anterior chamber was flushed with Miostat and the eye was inflated to physiologic pressure. 0.251ml Vigamox was placed in the anterior chamber. The wounds were found to be water tight. The eye was dressed with Vigamox. The patient was given protective glasses to wear throughout the day and a shield with which to sleep  tonight. The patient was also given drops with which to begin a drop regimen today and will follow-up with me in one day. Implant Name Type Inv. Item Serial No. Manufacturer Lot No. LRB No. Used  LENS IOL DIOP 18.5 - [id number] Intraocular Lens LENS IOL DIOP 18.5 [id number]  Left 1    Procedure(s) with comments: CATARACT EXTRACTION PHACO AND INTRAOCULAR LENS PLACEMENT (IOC) (Left) - [place] 00:30.9 CDE 3.81 Fluid pack Lot # [id number]  Electronically signed: Galen ManilaWilliam Dennice Tindol 05/13/2018 1:00 PM

## 2018-05-14 ENCOUNTER — Encounter: Payer: Self-pay | Admitting: Ophthalmology

## 2018-05-16 ENCOUNTER — Telehealth: Payer: Self-pay | Admitting: Urology

## 2018-05-16 NOTE — Telephone Encounter (Signed)
Notes recorded by Trudi IdaMabry, Jasmine L, RMA on 05/16/2018 at 8:59 AM EST Spoke with pt and informed him of results per Dr. Lonna CobbStoioff, pt is aware that he should call closer to March to get scheduled on the lab schedule so that results are back before his appointment. Pt understood and had no additional questions at this time. ------  Notes recorded by Riki AltesStoioff, Scott C, MD on 05/16/2018 at 8:17 AM EST PSA was 25.5-would repeat prior to his March office visit.

## 2018-08-20 NOTE — Progress Notes (Signed)
08/21/2018  9:42 AM   Keith Fuller 06/16/1951 [id number]  Referring provider: Lynnea Ferrier, MD [address] [facility] [place], [place] [address]  Chief Complaint  Patient presents with  . Elevated PSA    HPI: Keith Fuller is a 68 yo M who returns today for a 1 year f/u for the evaluation and management of elevated PSA.  -Long history of an elevated PSA up to the 40 range with multiple negative biopsies including saturation biopsies  -Remains on finasteride  -No bothersome LUTS -Denies dysuria or gross hematuria -Denies flank, abdominal, pelvic or scortal pain  -Prostate MRI April 2019 showed no suspicious lesions -Most recent PSA 25.5 from 05/12/2018   PMH: Past Medical History:  Diagnosis Date  . Allergy   . Arthritis   . GERD (gastroesophageal reflux disease)   . Hepatitis C    denies any virus in system now  . PSA elevation   . Tinnitus   . Tremors of nervous system    HANDS    Surgical History: Past Surgical History:  Procedure Laterality Date  . CATARACT EXTRACTION W/PHACO Right 04/15/2018   Procedure: CATARACT EXTRACTION PHACO AND INTRAOCULAR LENS PLACEMENT (IOC);  Surgeon: Galen Manila, MD;  Location: [facility] ORS;  Service: Ophthalmology;  Laterality: Right;  [place] 00:28 CDE 4.57 Fluid pack lot # [id number]  . CATARACT EXTRACTION W/PHACO Left 05/13/2018   Procedure: CATARACT EXTRACTION PHACO AND INTRAOCULAR LENS PLACEMENT (IOC);  Surgeon: Galen Manila, MD;  Location: [facility] ORS;  Service: Ophthalmology;  Laterality: Left;  [place] 00:30.9 CDE 3.81 Fluid pack Lot # [id number]  . COLONOSCOPY WITH PROPOFOL N/A 05/21/2016   Procedure: COLONOSCOPY WITH PROPOFOL;  Surgeon: Scot Jun, MD;  Location: [facility] ENDOSCOPY;  Service: Endoscopy;  Laterality: N/A;  Gentamicin and Vacomycin IVPB  . HERNIA REPAIR    . JOINT REPLACEMENT    . PROSTATE SURGERY     biopsy  . REPLACEMENT TOTAL KNEE BILATERAL Bilateral     Home  Medications:  Allergies as of 08/21/2018      Reactions   Penicillins    Made me feel odd  Has patient had a PCN reaction causing immediate rash, facial/tongue/throat swelling, SOB or lightheadedness with hypotension: Unknown Has patient had a PCN reaction causing severe rash involving mucus membranes or skin necrosis: Unknown Has patient had a PCN reaction that required hospitalization: Unknown Has patient had a PCN reaction occurring within the last 10 years: No If all of the above answers are "NO", then may proceed with Cephalosporin use.      Medication List       Accurate as of August 21, 2018  9:42 AM. Always use your most recent med list.        cyclobenzaprine 10 MG tablet Commonly known as:  FLEXERIL Take 1 tablet (10 mg total) by mouth 3 (three) times daily as needed for muscle spasms.   finasteride 5 MG tablet Commonly known as:  PROSCAR Take 5 mg by mouth daily.   Fish Oil 1000 MG Caps Take 2,000 mg by mouth daily.   omeprazole 20 MG capsule Commonly known as:  PRILOSEC Take 20 mg by mouth every morning.   TURMERIC PO Take 1 tablet by mouth daily.       Allergies:  Allergies  Allergen Reactions  . Penicillins     Made me feel odd  Has patient had a PCN reaction causing immediate rash, facial/tongue/throat swelling, SOB or lightheadedness with hypotension: Unknown Has  patient had a PCN reaction causing severe rash involving mucus membranes or skin necrosis: Unknown Has patient had a PCN reaction that required hospitalization: Unknown Has patient had a PCN reaction occurring within the last 10 years: No If all of the above answers are "NO", then may proceed with Cephalosporin use.     Family History: Family History  Problem Relation Age of Onset  . Arthritis Mother   . Cancer Father   . Stroke Father     Social History:  reports that he has quit smoking. He has never used smokeless tobacco. He reports current alcohol use. He reports that he does  not use drugs.  ROS: UROLOGY Frequent Urination?: No Hard to postpone urination?: No Burning/pain with urination?: No Get up at night to urinate?: No Leakage of urine?: No Urine stream starts and stops?: No Trouble starting stream?: No Do you have to strain to urinate?: No Blood in urine?: No Urinary tract infection?: No Sexually transmitted disease?: No Injury to kidneys or bladder?: No Painful intercourse?: No Weak stream?: No Erection problems?: No Penile pain?: No  Gastrointestinal Nausea?: No Vomiting?: No Indigestion/heartburn?: No Diarrhea?: No Constipation?: No  Constitutional Fever: No Night sweats?: No Weight loss?: No Fatigue?: No  Skin Skin rash/lesions?: No Itching?: No  Eyes Blurred vision?: No Double vision?: No  Ears/Nose/Throat Sore throat?: No Sinus problems?: No  Hematologic/Lymphatic Swollen glands?: No Easy bruising?: No  Cardiovascular Leg swelling?: No Chest pain?: No  Respiratory Cough?: No Shortness of breath?: No  Endocrine Excessive thirst?: No  Musculoskeletal Back pain?: Yes Joint pain?: No  Neurological Headaches?: No Dizziness?: No  Psychologic Depression?: No Anxiety?: No  Physical Exam: BP (!) 155/71 (BP Location: Left Arm, Patient Position: Sitting, Cuff Size: Large)   Pulse 62   Ht 5\' 9"  (1.753 m)   Wt 221 lb 1.6 oz (100.3 kg)   BMI 32.65 kg/m   Constitutional:  Well nourished. Alert and oriented, No acute distress. HEENT: [place] AT, moist mucus membranes.  Trachea midline, no masses. Cardiovascular: No clubbing, cyanosis, or edema. Respiratory: Normal respiratory effort, no increased work of breathing. Rectal: Patient with  normal sphincter tone. Anus and perineum without scarring or rashes. No rectal masses are appreciated. Smooth rectal tone. Prostate is approximately 40 grams, no nodules are appreciated. Seminal vesicles are normal. Skin: No rashes, bruises or suspicious lesions. Neurologic:  Grossly intact, no focal deficits, moving all 4 extremities. Psychiatric: Normal mood and affect.  Laboratory Data:  Component     Latest Ref Rng & Units 09/23/2017 05/12/2018  Prostate Specific Ag, Serum     0.0 - 4.0 ng/mL 20.1 (H) 25.5 (H)   Assessment & Plan:    1. Elevated PSA  -Multiple negative biopsies and negative prostate MRI -Remains on finasteride -Benign DRE  -Most recent PSA 25.5 from 05/12/2018  -PSA today - pending    Riki Altes, MD  [facility] [address] [place], [place] [address] [phone]  I, Donne Hazel, am acting as a scribe for Dr. Lorin Picket C. Stoioff,  I, Riki Altes, MD, have reviewed all documentation for this visit. The documentation on 08/21/18 for the exam, diagnosis, procedures, and orders are all accurate and complete.

## 2018-08-21 ENCOUNTER — Ambulatory Visit (INDEPENDENT_AMBULATORY_CARE_PROVIDER_SITE_OTHER): Payer: Medicare Other | Admitting: Urology

## 2018-08-21 ENCOUNTER — Encounter: Payer: Self-pay | Admitting: Urology

## 2018-08-21 VITALS — BP 155/71 | HR 62 | Ht 69.0 in | Wt 221.1 lb

## 2018-08-21 DIAGNOSIS — M5126 Other intervertebral disc displacement, lumbar region: Secondary | ICD-10-CM | POA: Insufficient documentation

## 2018-08-21 DIAGNOSIS — R972 Elevated prostate specific antigen [PSA]: Secondary | ICD-10-CM | POA: Diagnosis not present

## 2018-08-21 DIAGNOSIS — S73109A Unspecified sprain of unspecified hip, initial encounter: Secondary | ICD-10-CM | POA: Insufficient documentation

## 2018-08-21 DIAGNOSIS — M722 Plantar fascial fibromatosis: Secondary | ICD-10-CM | POA: Insufficient documentation

## 2018-08-21 DIAGNOSIS — M5137 Other intervertebral disc degeneration, lumbosacral region: Secondary | ICD-10-CM | POA: Insufficient documentation

## 2018-08-21 DIAGNOSIS — G5600 Carpal tunnel syndrome, unspecified upper limb: Secondary | ICD-10-CM | POA: Insufficient documentation

## 2018-08-21 DIAGNOSIS — M1710 Unilateral primary osteoarthritis, unspecified knee: Secondary | ICD-10-CM | POA: Insufficient documentation

## 2018-08-21 DIAGNOSIS — M179 Osteoarthritis of knee, unspecified: Secondary | ICD-10-CM | POA: Insufficient documentation

## 2018-08-21 NOTE — Patient Instructions (Signed)

## 2018-08-22 LAB — PSA: Prostate Specific Ag, Serum: 29.9 ng/mL — ABNORMAL HIGH (ref 0.0–4.0)

## 2018-08-25 ENCOUNTER — Telehealth: Payer: Self-pay | Admitting: Family Medicine

## 2018-08-25 NOTE — Telephone Encounter (Signed)
-----   Message from Riki Altes, MD sent at 08/23/2018 10:05 PM EST ----- PSA was 29.9.  Recheck 6 months

## 2018-08-25 NOTE — Telephone Encounter (Signed)
Patient notified and appointment scheduled. 

## 2018-09-23 ENCOUNTER — Ambulatory Visit: Payer: Medicare Other | Admitting: Urology

## 2019-02-23 ENCOUNTER — Other Ambulatory Visit: Payer: Self-pay | Admitting: Family Medicine

## 2019-02-23 DIAGNOSIS — R972 Elevated prostate specific antigen [PSA]: Secondary | ICD-10-CM

## 2019-02-24 ENCOUNTER — Other Ambulatory Visit: Payer: Self-pay

## 2019-02-24 ENCOUNTER — Other Ambulatory Visit: Payer: Medicare Other

## 2019-02-24 DIAGNOSIS — R972 Elevated prostate specific antigen [PSA]: Secondary | ICD-10-CM

## 2019-02-25 ENCOUNTER — Telehealth: Payer: Self-pay | Admitting: Family Medicine

## 2019-02-25 LAB — PSA: Prostate Specific Ag, Serum: 25.4 ng/mL — ABNORMAL HIGH (ref 0.0–4.0)

## 2019-02-25 NOTE — Telephone Encounter (Signed)
-----   Message from Abbie Sons, MD sent at 02/25/2019  8:32 AM EDT ----- PSA stable at 25.4.  Follow-up as scheduled

## 2019-02-25 NOTE — Telephone Encounter (Signed)
Patient notified and voiced understanding.

## 2019-08-18 ENCOUNTER — Other Ambulatory Visit: Payer: Self-pay

## 2019-08-18 DIAGNOSIS — R972 Elevated prostate specific antigen [PSA]: Secondary | ICD-10-CM

## 2019-08-19 ENCOUNTER — Other Ambulatory Visit: Payer: Medicare Other

## 2019-08-19 ENCOUNTER — Other Ambulatory Visit: Payer: Self-pay

## 2019-08-19 DIAGNOSIS — R972 Elevated prostate specific antigen [PSA]: Secondary | ICD-10-CM

## 2019-08-20 LAB — PSA: Prostate Specific Ag, Serum: 25.1 ng/mL — ABNORMAL HIGH (ref 0.0–4.0)

## 2019-08-21 ENCOUNTER — Ambulatory Visit: Payer: Medicare Other | Admitting: Urology

## 2019-08-24 ENCOUNTER — Encounter: Payer: Self-pay | Admitting: Urology

## 2019-08-24 ENCOUNTER — Other Ambulatory Visit: Payer: Self-pay

## 2019-08-24 ENCOUNTER — Ambulatory Visit (INDEPENDENT_AMBULATORY_CARE_PROVIDER_SITE_OTHER): Payer: Medicare Other | Admitting: Urology

## 2019-08-24 VITALS — BP 166/77 | HR 76 | Ht 69.0 in | Wt 219.0 lb

## 2019-08-24 DIAGNOSIS — R972 Elevated prostate specific antigen [PSA]: Secondary | ICD-10-CM | POA: Diagnosis not present

## 2019-08-28 ENCOUNTER — Encounter: Payer: Self-pay | Admitting: Urology

## 2019-08-28 NOTE — Progress Notes (Signed)
08/24/2019 6:50 AM   Keith Fuller 1951/01/21 [id number]  Referring provider: Adin Hector, MD [facility] [place],  [place] [address]  Chief Complaint  Patient presents with  . Elevated PSA    Urologic history: 1.  Elevated PSA -Prostate biopsy 01/2007 PSA 17; benign pathology -Repeat biopsy 08/2007 PSA 28; benign pathology -PSA bump 36 04/2008; urine PCA 3 neg; declined rebiopsy -PSA 44 08/2009; saturation biopsies under anesthesia 10/2009; benign pathology -Finasteride started ~ 2015 -Prostate MRI 09/2017 39 cc volume; no suspicious lesions or adenopathy   HPI: 69 y.o. male presents for annual follow-up.  He has no bothersome lower urinary tract symptoms.  He has complained of some penile retraction.  Denies dysuria or gross hematuria.  No flank, abdominal or pelvic pain.  His uncorrected PSA August 2020 was 25.4 and repeat PSA performed last week was 25.1.  PSA trend:  2008  17 08/2007  28 04/2008 36 08/2009  44 07/2010  31.8 11/2012  20.5 Finasteride started ~2015 01/2014  8.35 08/2014  8 05/2015  10.9 08/2015  8.67 04/2016 8.84 08/2017  20.1 05/2018 25.5 08/2018  29.9 01/2019  25.4 08/2019  25.1   PMH: Past Medical History:  Diagnosis Date  . Allergy   . Arthritis   . GERD (gastroesophageal reflux disease)   . Hepatitis C    denies any virus in system now  . PSA elevation   . Tinnitus   . Tremors of nervous system    HANDS    Surgical History: Past Surgical History:  Procedure Laterality Date  . CATARACT EXTRACTION W/PHACO Right 04/15/2018   Procedure: CATARACT EXTRACTION PHACO AND INTRAOCULAR LENS PLACEMENT ([place]);  Surgeon: Birder Robson, MD;  Location: [facility] ORS;  Service: Ophthalmology;  Laterality: Right;  [place] 00:28 CDE 4.57 Fluid pack lot # [id number]  . CATARACT EXTRACTION W/PHACO Left 05/13/2018   Procedure: CATARACT EXTRACTION PHACO AND INTRAOCULAR LENS PLACEMENT (IOC);  Surgeon: Birder Robson, MD;   Location: [facility] ORS;  Service: Ophthalmology;  Laterality: Left;  [place] 00:30.9 CDE 3.81 Fluid pack Lot # [id number]  . COLONOSCOPY WITH PROPOFOL N/A 05/21/2016   Procedure: COLONOSCOPY WITH PROPOFOL;  Surgeon: Manya Silvas, MD;  Location[facility] ENDOSCOPY;  Service: Endoscopy;  Laterality: N/A;  Gentamicin and Vacomycin IVPB  . HERNIA REPAIR    . JOINT REPLACEMENT    . PROSTATE SURGERY     biopsy  . REPLACEMENT TOTAL KNEE BILATERAL Bilateral     Home Medications:  Allergies as of 08/24/2019      Reactions   Penicillins    Made me feel odd  Has patient had a PCN reaction causing immediate rash, facial/tongue/throat swelling, SOB or lightheadedness with hypotension: Unknown Has patient had a PCN reaction causing severe rash involving mucus membranes or skin necrosis: Unknown Has patient had a PCN reaction that required hospitalization: Unknown Has patient had a PCN reaction occurring within the last 10 years: No If all of the above answers are "NO", then may proceed with Cephalosporin use.      Medication List       Accurate as of August 24, 2019 11:59 PM. If you have any questions, ask your nurse or doctor.        cyclobenzaprine 10 MG tablet Commonly known as: FLEXERIL Take 1 tablet (10 mg total) by mouth 3 (three) times daily as needed for muscle spasms.   finasteride 5 MG tablet Commonly known as: PROSCAR Take 5 mg by mouth  daily.   Fish Oil 1000 MG Caps Take 2,000 mg by mouth daily.   gabapentin 300 MG capsule Commonly known as: NEURONTIN Take 300 mg by mouth 3 (three) times daily.   omeprazole 20 MG capsule Commonly known as: PRILOSEC Take 20 mg by mouth every morning.   TURMERIC PO Take 1 tablet by mouth daily.       Allergies:  Allergies  Allergen Reactions  . Penicillins     Made me feel odd  Has patient had a PCN reaction causing immediate rash, facial/tongue/throat swelling, SOB or lightheadedness with hypotension: Unknown Has patient had a PCN  reaction causing severe rash involving mucus membranes or skin necrosis: Unknown Has patient had a PCN reaction that required hospitalization: Unknown Has patient had a PCN reaction occurring within the last 10 years: No If all of the above answers are "NO", then may proceed with Cephalosporin use.     Family History: Family History  Problem Relation Age of Onset  . Arthritis Mother   . Cancer Father   . Stroke Father     Social History:  reports that he has quit smoking. He has never used smokeless tobacco. He reports current alcohol use. He reports that he does not use drugs.   Physical Exam: BP (!) 166/77 (BP Location: Left Arm, Patient Position: Sitting, Cuff Size: Large)   Pulse 76   Ht 5\' 9"  (1.753 m)   Wt 219 lb (99.3 kg)   BMI 32.34 kg/m   Constitutional:  Alert and oriented, No acute distress. HEENT: [place] AT, moist mucus membranes.  Trachea midline, no masses. Cardiovascular: No clubbing, cyanosis, or edema. Respiratory: Normal respiratory effort, no increased work of breathing. GU: Prostate 40 g, smooth without nodules Skin: No rashes, bruises or suspicious lesions. Neurologic: Grossly intact, no focal deficits, moving all 4 extremities. Psychiatric: Normal mood and affect.    Assessment & Plan:    - Elevated PSA Marked PSA elevation with last biopsy in 2011 which was a saturation biopsy for PSA of 44.  He has declined further biopsies.  Prostate MRI 2019 showed no suspicious lesions.  Current uncorrected PSA is slightly above baseline but stable.  Continue finasteride  Recommend a follow-up lab visit/PSA in 6 months and office visit/DRE 1 year.   2020, MD  [facility] [address], [address] [place], [place] [phone]

## 2020-02-22 ENCOUNTER — Other Ambulatory Visit: Payer: Self-pay

## 2020-02-22 DIAGNOSIS — R972 Elevated prostate specific antigen [PSA]: Secondary | ICD-10-CM

## 2020-02-23 ENCOUNTER — Other Ambulatory Visit: Payer: Medicare Other

## 2020-02-23 ENCOUNTER — Other Ambulatory Visit: Payer: Self-pay

## 2020-02-23 DIAGNOSIS — R972 Elevated prostate specific antigen [PSA]: Secondary | ICD-10-CM

## 2020-02-24 ENCOUNTER — Telehealth: Payer: Self-pay | Admitting: *Deleted

## 2020-02-24 LAB — PSA: Prostate Specific Ag, Serum: 18.6 ng/mL — ABNORMAL HIGH (ref 0.0–4.0)

## 2020-02-24 NOTE — Telephone Encounter (Signed)
Notified patient as instructed, patient pleased. Discussed follow-up appointments, patient agrees  

## 2020-02-24 NOTE — Telephone Encounter (Signed)
-----   Message from Riki Altes, MD sent at 02/24/2020  7:53 AM EDT ----- PSA lower at 18.6.  Follow-up as scheduled

## 2020-03-22 ENCOUNTER — Other Ambulatory Visit: Payer: Self-pay | Admitting: *Deleted

## 2020-03-22 MED ORDER — FINASTERIDE 5 MG PO TABS: 5.0000 mg | ORAL_TABLET | Freq: Every day | ORAL | 3 refills | Status: AC

## 2020-06-01 DEATH — deceased

## 2020-08-03 ENCOUNTER — Other Ambulatory Visit: Payer: Self-pay

## 2020-08-03 DIAGNOSIS — R972 Elevated prostate specific antigen [PSA]: Secondary | ICD-10-CM

## 2020-08-22 ENCOUNTER — Other Ambulatory Visit: Payer: Self-pay

## 2020-08-24 ENCOUNTER — Ambulatory Visit: Payer: Self-pay | Admitting: Urology
